# Patient Record
Sex: Female | Born: 1960
Health system: Southern US, Community
[De-identification: ages and names within clinical notes are randomized; demographics above are authoritative.]

## PROBLEM LIST (undated history)

## (undated) DIAGNOSIS — F32A Depression, unspecified: Secondary | ICD-10-CM

## (undated) DIAGNOSIS — N814 Uterovaginal prolapse, unspecified: Secondary | ICD-10-CM

## (undated) DIAGNOSIS — N390 Urinary tract infection, site not specified: Secondary | ICD-10-CM

## (undated) DIAGNOSIS — F329 Major depressive disorder, single episode, unspecified: Secondary | ICD-10-CM

## (undated) DIAGNOSIS — N852 Hypertrophy of uterus: Secondary | ICD-10-CM

## (undated) DIAGNOSIS — M1990 Unspecified osteoarthritis, unspecified site: Secondary | ICD-10-CM

## (undated) DIAGNOSIS — M810 Age-related osteoporosis without current pathological fracture: Secondary | ICD-10-CM

## (undated) DIAGNOSIS — R519 Headache, unspecified: Secondary | ICD-10-CM

## (undated) DIAGNOSIS — N840 Polyp of corpus uteri: Secondary | ICD-10-CM

## (undated) DIAGNOSIS — D72819 Decreased white blood cell count, unspecified: Secondary | ICD-10-CM

## (undated) HISTORY — PX: CYSTOSCOPY: SUR368

## (undated) HISTORY — DX: Urinary tract infection, site not specified: N39.0

## (undated) HISTORY — DX: Polyp of corpus uteri: N84.0

## (undated) HISTORY — DX: Uterovaginal prolapse, unspecified: N81.4

## (undated) HISTORY — DX: Major depressive disorder, single episode, unspecified: F32.9

## (undated) HISTORY — DX: Decreased white blood cell count, unspecified: D72.819

## (undated) HISTORY — DX: Depression, unspecified: F32.A

## (undated) HISTORY — PX: COLONOSCOPY: SHX174

## (undated) HISTORY — DX: Hypertrophy of uterus: N85.2

## (undated) HISTORY — DX: Age-related osteoporosis without current pathological fracture: M81.0

---

## 1992-01-30 HISTORY — PX: TUBAL LIGATION: SHX77

## 1996-01-30 HISTORY — PX: CHOLECYSTECTOMY: SHX55

## 2014-01-13 ENCOUNTER — Encounter: Payer: Self-pay | Admitting: Obstetrics and Gynecology

## 2014-01-13 ENCOUNTER — Ambulatory Visit (INDEPENDENT_AMBULATORY_CARE_PROVIDER_SITE_OTHER): Payer: BC Managed Care – PPO | Admitting: Obstetrics and Gynecology

## 2014-01-13 ENCOUNTER — Other Ambulatory Visit: Payer: Self-pay | Admitting: Obstetrics and Gynecology

## 2014-01-13 VITALS — BP 94/60 | HR 68 | Resp 16 | Ht 67.0 in | Wt 128.0 lb

## 2014-01-13 DIAGNOSIS — Z0000 Encounter for general adult medical examination without abnormal findings: Secondary | ICD-10-CM

## 2014-01-13 DIAGNOSIS — N951 Menopausal and female climacteric states: Secondary | ICD-10-CM

## 2014-01-13 DIAGNOSIS — K219 Gastro-esophageal reflux disease without esophagitis: Secondary | ICD-10-CM

## 2014-01-13 DIAGNOSIS — Z01419 Encounter for gynecological examination (general) (routine) without abnormal findings: Secondary | ICD-10-CM

## 2014-01-13 DIAGNOSIS — Z1211 Encounter for screening for malignant neoplasm of colon: Secondary | ICD-10-CM

## 2014-01-13 DIAGNOSIS — IMO0001 Reserved for inherently not codable concepts without codable children: Secondary | ICD-10-CM

## 2014-01-13 LAB — POCT URINALYSIS DIPSTICK
Bilirubin, UA: NEGATIVE
Blood, UA: NEGATIVE
Glucose, UA: NEGATIVE
Ketones, UA: NEGATIVE
Leukocytes, UA: NEGATIVE
Nitrite, UA: NEGATIVE
Protein, UA: NEGATIVE
Urobilinogen, UA: NEGATIVE
pH, UA: 6

## 2014-01-13 LAB — CBC
HCT: 37.7 % (ref 36.0–46.0)
Hemoglobin: 12.8 g/dL (ref 12.0–15.0)
MCH: 30.5 pg (ref 26.0–34.0)
MCHC: 34 g/dL (ref 30.0–36.0)
MCV: 89.8 fL (ref 78.0–100.0)
MPV: 10.8 fL (ref 9.4–12.4)
Platelets: 197 10*3/uL (ref 150–400)
RBC: 4.2 MIL/uL (ref 3.87–5.11)
RDW: 13.5 % (ref 11.5–15.5)
WBC: 5.5 10*3/uL (ref 4.0–10.5)

## 2014-01-13 LAB — HEMOGLOBIN, FINGERSTICK: Hemoglobin, fingerstick: 12.9 g/dL (ref 12.0–16.0)

## 2014-01-13 MED ORDER — ESTRADIOL 0.1 MG/GM VA CREA: TOPICAL_CREAM | VAGINAL | Status: DC

## 2014-01-13 MED ORDER — NITROFURANTOIN MONOHYD MACRO 100 MG PO CAPS: ORAL_CAPSULE | ORAL | Status: DC

## 2014-01-13 NOTE — Progress Notes (Signed)
53 y.o. [id number] MarriedCaucasianF here for annual exam.    Developed heavy menses.  Diagnosed with hysteroscopy with polypectomy.  Skipped cycles from March 2014 until September 2014, when heavy cycle resumed.  Now no menses since then.   Only a few hot flashes.  Vaginal dryness. Declines HRT.  Some breast tenderness this week.  Hx of left breast cysts.   History of recurrent UTIs more recently since menopause. 3 UTIs this year. Usually takes Macrobid for prophylaxis with intercourse and would like a refill.  [place]   Patient's last menstrual period was 09/29/2012.          Sexually active: Yes.    The current method of family planning is tubal ligation and post menopausal status.    Exercising: No.  The patient does not participate in regular exercise at present. Smoker:  no  Health Maintenance: Pap: 2014 Normal  History of abnormal Pap:  Yes, after the birth of her third child.  History of cryotherapy in 1994.  MMG:  03/2013 Normal - Dense Breast tissue Colonoscopy:  never BMD:   never TDaP: <10 years. Screening Labs: today, Hb today: 12.9, Urine today: Negative.   reports that she has never smoked. She has never used smokeless tobacco. She reports that she drinks about 0.6 - 1.2 oz of alcohol per week. She reports that she does not use illicit drugs.  Past Medical History  Diagnosis Date  . Hypertrophy of uterus   . Uterine prolapse   . Polyp of corpus uteri   . Depression     Past Surgical History  Procedure Laterality Date  . Cholecystectomy  1998  . Tubal ligation  1994    Current Outpatient Prescriptions  Medication Sig Dispense Refill  . citalopram (CELEXA) 20 MG tablet Take 20 mg by mouth at bedtime.  0  . FROVA 2.5 MG tablet Take 2.5 mg by mouth as needed.   3   No current facility-administered medications for this visit.    Family History  Problem Relation Age of Onset  . Hypertension Mother   . Neuropathy Mother      ROS:  Pertinent items are noted in HPI.  Otherwise, a comprehensive ROS was negative.  Exam:   BP 94/60 mmHg  Pulse 68  Resp 16  Ht 5\' 7"  (1.702 m)  Wt 128 lb (58.06 kg)  BMI 20.04 kg/m2  LMP 09/29/2012     Height: 5\' 7"  (170.2 cm)  Ht Readings from Last 3 Encounters:  01/13/14 5\' 7"  (1.702 m)    General appearance: alert, cooperative and appears stated age Head: Normocephalic, without obvious abnormality, atraumatic Neck: no adenopathy, supple, symmetrical, trachea midline and thyroid normal to inspection and palpation Lungs: clear to auscultation bilaterally Breasts: normal appearance, no masses or tenderness, No nipple retraction or dimpling, No nipple discharge or bleeding, No axillary or supraclavicular adenopathy, Left lateral breast skin with 3 mm dark brown/black flat nevus. Heart: regular rate and rhythm Abdomen: soft, non-tender; bowel sounds normal; no masses,  no organomegaly Extremities: extremities normal, atraumatic, no cyanosis or edema Skin: Skin color, texture, turgor normal. No rashes or lesions Lymph nodes: Cervical, supraclavicular, and axillary nodes normal. No abnormal inguinal nodes palpated Neurologic: Grossly normal   Pelvic: External genitalia:  no lesions              Urethra:  normal appearing urethra with no masses, tenderness or lesions  Bartholins and Skenes: normal                 Vagina: normal appearing vagina with normal color and discharge, no lesions              Cervix: no lesions              Pap taken: No. Bimanual Exam:  Uterus:  normal size, contour, position, consistency, mobility, non-tender              Adnexa: normal adnexa and no mass, fullness, tenderness               Rectovaginal: Confirms               Anus:  normal sphincter tone, no lesions  Chaperone was present for exam.  A:  Well Woman with normal exam Menopausal female. Vaginal atrophic changes.  Skin lesion of left breast skin.  History of left  breast cysts. Some recent breast sensitivity.  I do not feel a discreet lump. Recurrent UTIs. Need for colon cancer screening and evaluation for reflux.   P:   Mammogram yearly.  Given information for [facility].  pap smear not indicated.  FSH and estradiol.  Estradiol cream.  Instructed in used.  See orders.  Discussed risks of breast cancer, DVT, PE, MI, stroke. Routine labs.  Brochure on menopause.  Will see dermatology.  Gave names to patient.  Referral to Dr. Loreta AveMann, GI. return annually or prn

## 2014-01-13 NOTE — Progress Notes (Deleted)
53 y.o. No obstetric history on file. Married{Race/ethnicity:17218}F here for annual exam.    No LMP recorded.          Sexually active: {yes no:314532}  The current method of family planning is {contraception:315051}.    Exercising: {yes no:314532}  {types:19826} Smoker:  {YES NO:22349}  Health Maintenance: Pap:  *** History of abnormal Pap:  {YES NO:22349} MMG:  *** Colonoscopy:  *** BMD:   *** TDaP:  *** Screening Labs: ***, Hb today: ***, Urine today: ***     No past medical history on file.  No past surgical history on file.  Current Outpatient Prescriptions  Medication Sig Dispense Refill  . citalopram (CELEXA) 20 MG tablet Take 20 mg by mouth at bedtime.  0  . FROVA 2.5 MG tablet   3  . nitrofurantoin, macrocrystal-monohydrate, (MACROBID) 100 MG capsule   0   No current facility-administered medications for this visit.    No family history on file.  ROS:  Pertinent items are noted in HPI.  Otherwise, a comprehensive ROS was negative.  Exam:   There were no vitals taken for this visit.  Weight change: @WEIGHTCHANGE @ Height:      Ht Readings from Last 3 Encounters:  No data found for Ht    General appearance: alert, cooperative and appears stated age Head: Normocephalic, without obvious abnormality, atraumatic Neck: no adenopathy, supple, symmetrical, trachea midline and thyroid {EXAM; THYROID:18604} Lungs: clear to auscultation bilaterally Breasts: {Exam; breast:13139::"normal appearance, no masses or tenderness"} Heart: regular rate and rhythm Abdomen: soft, non-tender; bowel sounds normal; no masses,  no organomegaly Extremities: extremities normal, atraumatic, no cyanosis or edema Skin: Skin color, texture, turgor normal. No rashes or lesions Lymph nodes: Cervical, supraclavicular, and axillary nodes normal. No abnormal inguinal nodes palpated Neurologic: Grossly normal   Pelvic: External genitalia:  no lesions              Urethra:  normal appearing  urethra with no masses, tenderness or lesions              Bartholins and Skenes: normal                 Vagina: normal appearing vagina with normal color and discharge, no lesions              Cervix: {exam; cervix:14595}              Pap taken: {yes no:314532} Bimanual Exam:  Uterus:  {exam; uterus:12215}              Adnexa: {exam; adnexa:12223}               Rectovaginal: Confirms               Anus:  normal sphincter tone, no lesions  Chaperone was present for exam.  A:  Well Woman with normal exam  P:   {plan; gyn:5269::"mammogram","pap smear","return annually or prn"}

## 2014-01-13 NOTE — Patient Instructions (Signed)

## 2014-01-14 LAB — COMPREHENSIVE METABOLIC PANEL
ALT: 16 U/L (ref 0–35)
AST: 25 U/L (ref 0–37)
Albumin: 4.6 g/dL (ref 3.5–5.2)
Alkaline Phosphatase: 57 U/L (ref 39–117)
BUN: 10 mg/dL (ref 6–23)
CO2: 30 mEq/L (ref 19–32)
Calcium: 9.5 mg/dL (ref 8.4–10.5)
Chloride: 99 mEq/L (ref 96–112)
Creat: 0.58 mg/dL (ref 0.50–1.10)
Glucose, Bld: 105 mg/dL — ABNORMAL HIGH (ref 70–99)
Potassium: 3.7 mEq/L (ref 3.5–5.3)
Sodium: 138 mEq/L (ref 135–145)
Total Bilirubin: 0.8 mg/dL (ref 0.2–1.2)
Total Protein: 6.9 g/dL (ref 6.0–8.3)

## 2014-01-14 LAB — LIPID PANEL
Cholesterol: 165 mg/dL (ref 0–200)
HDL: 94 mg/dL (ref >39)
LDL Cholesterol: 59 mg/dL (ref 0–99)
Total CHOL/HDL Ratio: 1.8 Ratio
Triglycerides: 58 mg/dL (ref <150)
VLDL: 12 mg/dL (ref 0–40)

## 2014-01-14 LAB — FOLLICLE STIMULATING HORMONE: FSH: 36.9 m[IU]/mL

## 2014-01-14 LAB — ESTRADIOL: Estradiol: 213.8 pg/mL

## 2014-01-15 LAB — TSH: TSH: 0.945 u[IU]/mL (ref 0.350–4.500)

## 2014-01-15 NOTE — Addendum Note (Signed)
Addended by: Annamaria HellingAMUNDSON DE CARVALHO E SILVA, BROOK E on: 01/15/2014 07:07 AM   Modules accepted: Orders

## 2014-01-16 LAB — PROLACTIN: Prolactin: 8.6 ng/mL

## 2014-01-18 ENCOUNTER — Telehealth: Payer: Self-pay

## 2014-01-18 DIAGNOSIS — N912 Amenorrhea, unspecified: Secondary | ICD-10-CM

## 2014-01-18 NOTE — Telephone Encounter (Signed)
Please inform patient that her TSH and Prolactin are in a normal range.          We are in the process of evaluating amenorrhea for her.         I would like for the patient to see me in January for a pelvic ultrasound and possible endometrial biopsy to evaluate the lining of her uterus.    Her hormone testing indicates she is premenopausal, but she has not had a cycle for over a year.         Cc - Maureen LawsAmanda Dixon

## 2014-01-18 NOTE — Telephone Encounter (Signed)
Spoke with patient. Advised patient of message as seen below from Dr.Silva. Patient is agreeable and verbalizes understanding. Appointment scheduled for PUS and possible EMB on 02/04/14 at 9:30am with 10am consult with Dr.Silva. Patient is agreeable to date and time. Advised patient to take 800 mg of ibuprofen/motrin 1 hours before appointment in case EMB is needed. Patient is agreeable. Order placed for PUS and EMB. Will need precert.  Cc: Maureen Davis  Routing to provider for final review. Patient agreeable to disposition. Will close encounter

## 2014-01-18 NOTE — Telephone Encounter (Signed)
2016 benefits are not available at this time.

## 2014-01-18 NOTE — Telephone Encounter (Signed)
-----   Message from [place] E Amundson de G[place]enevere Ghaziarvalho E Silva, MD sent at 01/15/2014  7:11 AM EST ----- Please let patient know that she is not menopausal by her lab work.  FSH is not really elevated and estrogen levels are still high.  I have added a prolactin and TSH to her blood work to look for other reasons for her not cycling.  (It is possible that another menstruation is about to happen.)  I know that she has had a tubal ligation, but even a urine pregnancy test is part of the protocol to understand this further.  We did not do one in the office the other day as we thought she was just in menopause before we did the blood work.

## 2014-01-29 DIAGNOSIS — D72819 Decreased white blood cell count, unspecified: Secondary | ICD-10-CM

## 2014-01-29 HISTORY — DX: Decreased white blood cell count, unspecified: D72.819

## 2014-02-02 ENCOUNTER — Telehealth: Payer: Self-pay | Admitting: Obstetrics and Gynecology

## 2014-02-02 NOTE — Telephone Encounter (Signed)
Pt has new insurance. Please call to precert for ultrasound scheduled on Thursday. [employer]/ ID# [id number] GROUP# [id number]. Told patient I will give information to [place] who will call her once it has been precerted.

## 2014-02-02 NOTE — Telephone Encounter (Signed)
Left message for patient to call back  

## 2014-02-02 NOTE — Telephone Encounter (Signed)
Left message for patient to call back. Need to go over benefits and out of pocket expectation for appt scheduled 01.07.2016.

## 2014-02-02 NOTE — Telephone Encounter (Signed)
Returned call

## 2014-02-02 NOTE — Telephone Encounter (Signed)
Pr [phone]

## 2014-02-03 NOTE — Telephone Encounter (Signed)
Dr.Silva, patient is scheduled for PUS tomorrow for evaluation of amenorrhea. Patients OOP cost is high and she would like to know if this is needed at this time. Patient has also sent in old records for your review which I have placed on your desk. Please advise.

## 2014-02-03 NOTE — Telephone Encounter (Signed)
Spoke with patient. Advised patient of message as seen below from Dr.Silva. Patient is agreeable and verbalizes understanding. Will keep appointment for 02/04/14 for PUS as scheduled with Dr.Silva.  Routing to provider for final review. Patient agreeable to disposition. Will close encounter

## 2014-02-03 NOTE — Telephone Encounter (Signed)
I recommend proceeding with pelvic ultrasound. No menses in over a year and her lab testing looks like premenopause.  We need to evaluate for endometrial hyperplasia. May also need endometrial biopsy. Would be determined after seeing ultrasound report and images.  She may choose to have [facility] - if they accept payment plans.  The overall cost there would be higher.

## 2014-02-03 NOTE — Telephone Encounter (Signed)
Per Martie LeeSabrina, gave patient the information on what she is responsible for out of pocket when she come in for ultrasound tomorrow. She was agreeable and said ok.

## 2014-02-03 NOTE — Telephone Encounter (Signed)
Jannet AskewKaitlyn E Hines, RN at 01/18/2014 12:09 PM     Status: Signed       Expand All Collapse All   Please inform patient that her TSH and Prolactin are in a normal range.          We are in the process of evaluating amenorrhea for her.         I would like for the patient to see me in January for a pelvic ultrasound and possible endometrial biopsy to evaluate the lining of her uterus.    Her hormone testing indicates she is premenopausal, but she has not had a cycle for over a year.         Cc - Claudette LawsAmanda Dixon                Jannet AskewKaitlyn E Hines, RN at 01/18/2014 12:09 PM     Status: Signed       Expand All Collapse All   ----- Message from [place] E Amundson de Gwenevere Ghaziarvalho E Silva, MD sent at 01/15/2014 7:11 AM EST ----- Please let patient know that she is not menopausal by her lab work.  FSH is not really elevated and estrogen levels are still high.  I have added a prolactin and TSH to her blood work to look for other reasons for her not cycling.  (It is possible that another menstruation is about to happen.)  I know that she has had a tubal ligation, but even a urine pregnancy test is part of the protocol to understand this further.  We did not do one in the office the other day as we thought she was just in menopause before we did the blood work.

## 2014-02-03 NOTE — Telephone Encounter (Signed)
Pt would like to possibly speak with Dr Edward JollySilva to see if she has reviewed her old records prior to appointment for ultrasound to see if she really need to have one done.

## 2014-02-04 ENCOUNTER — Encounter: Payer: Self-pay | Admitting: Obstetrics and Gynecology

## 2014-02-04 ENCOUNTER — Ambulatory Visit (INDEPENDENT_AMBULATORY_CARE_PROVIDER_SITE_OTHER): Payer: 59

## 2014-02-04 ENCOUNTER — Ambulatory Visit (INDEPENDENT_AMBULATORY_CARE_PROVIDER_SITE_OTHER): Payer: 59 | Admitting: Obstetrics and Gynecology

## 2014-02-04 VITALS — BP 100/58 | HR 68 | Ht 67.0 in | Wt 130.0 lb

## 2014-02-04 DIAGNOSIS — R938 Abnormal findings on diagnostic imaging of other specified body structures: Secondary | ICD-10-CM

## 2014-02-04 DIAGNOSIS — N912 Amenorrhea, unspecified: Secondary | ICD-10-CM

## 2014-02-04 DIAGNOSIS — R9389 Abnormal findings on diagnostic imaging of other specified body structures: Secondary | ICD-10-CM

## 2014-02-04 NOTE — Patient Instructions (Signed)
Endometrial Biopsy, Care After Refer to this sheet in the next few weeks. These instructions provide you with information on caring for yourself after your procedure. Your health care provider may also give you more specific instructions. Your treatment has been planned according to current medical practices, but problems sometimes occur. Call your health care provider if you have any problems or questions after your procedure. WHAT TO EXPECT AFTER THE PROCEDURE After your procedure, it is typical to have the following:  You may have mild cramping and a small amount of vaginal bleeding for a few days after the procedure. This is normal. HOME CARE INSTRUCTIONS  Only take over-the-counter or prescription medicine as directed by your health care provider.  Do not douche, use tampons, or have sexual intercourse until your health care provider approves.  Follow your health care provider's instructions regarding any activity restrictions, such as strenuous exercise or heavy lifting. SEEK MEDICAL CARE IF:  You have heavy bleeding or bleeding longer than 2 days after the procedure.  You have bad smelling drainage from your vagina.  You have a fever and chills.  Youhave severe lower stomach (abdominal) pain. SEEK IMMEDIATE MEDICAL CARE IF:  You have severe cramps in your stomach or back.  You pass large blood clots.  Your bleeding increases.  You become weak or lightheaded, or you pass out. Document Released: 11/05/2012 Document Reviewed: 11/05/2012 ExitCare Patient Information 2015 [employer]. This information is not intended to replace advice given to you by your health care provider. Make sure you discuss any questions you have with your health care provider.  

## 2014-02-04 NOTE — Progress Notes (Signed)
Subjective  Patient is here today for a pelvic ultrasound for amenorrhea and premenopausal FSH.  FSH 36.9, E2 213.8. Not taking any HRT.   LMP Sept. 2014.  Status post BTL.  History of [place] hyster[place].   Objective  See ultrasound - images and report reviewed with patient.   Uterus with no masses.  Endometrial focus 10 mm, no blood supply.  Left ovary with 14 mm follicle.  Right ovary unremarkable.  No free fluid.     Procedure - endometrial biopsy Consent performed. Speculum place in vagina.  Sterile prep of cervix with Hibiclens. Pipelle placed to   8       cm without difficulty twice. Tissue obtained and sent to pathology. Speculum removed.  No complications.  Assessment   Thickened endometrium.  Amenorrhea. Elevated estradiol level and lowish FSH.  History of prior hysteroscopic polypectomy.  Atrophic vagina.   Plan   Discussed ultrasound findings.  Follow up EMB. Anticipated Provera 10 mg po q day for 10 days minimum.  ? Repeat hysteroscopy.   25 minutes face to face time of which ober 50% was spent in counseling.   After visit summary to patient.

## 2014-02-08 LAB — IPS OTHER TISSUE BIOPSY

## 2014-02-10 ENCOUNTER — Telehealth: Payer: Self-pay

## 2014-02-10 MED ORDER — MEDROXYPROGESTERONE ACETATE 10 MG PO TABS: 10.0000 mg | ORAL_TABLET | Freq: Every day | ORAL | Status: DC

## 2014-02-10 NOTE — Telephone Encounter (Signed)
-----   Message from [place] E Amundson de G[place]enevere Ghaziarvalho E Silva, MD sent at 02/09/2014  5:44 PM EST ----- Please contact patient with the good news about her negative endometrial biopsy.  No polyp, cancer, or hyperplasia was seen.   I am recommending Provera 10 mg po q day for 14 days.  Please send to her pharmacy of choice.  She and I discussed this at her ultrasound visit as the likely plan.   Have her keep a bleeding calendar and call to report if she had a cycle with the Provera.   Cc- Claudette LawsAmanda Dixon

## 2014-02-10 NOTE — Telephone Encounter (Signed)
Spoke with patient. Results given as seen below. Patient is agreeable and verbalizes understanding. Rx for Provera 10mg  #14 0RF sent to [facility] on file. Patient is agreeable. Will monitor bleeding and return call with positive or negative bleed.  Routing to provider for final review. Patient agreeable to disposition. Will close encounter

## 2014-02-10 NOTE — Telephone Encounter (Signed)
Left message to call Shamarra Warda at [phone]. 

## 2014-02-10 NOTE — Telephone Encounter (Signed)
Returning call.

## 2014-12-17 ENCOUNTER — Encounter: Payer: Self-pay | Admitting: Obstetrics and Gynecology

## 2014-12-17 ENCOUNTER — Ambulatory Visit (INDEPENDENT_AMBULATORY_CARE_PROVIDER_SITE_OTHER): Payer: 59 | Admitting: Obstetrics and Gynecology

## 2014-12-17 VITALS — BP 110/60 | HR 68 | Resp 14 | Ht 67.0 in | Wt 131.4 lb

## 2014-12-17 DIAGNOSIS — Z01419 Encounter for gynecological examination (general) (routine) without abnormal findings: Secondary | ICD-10-CM | POA: Diagnosis not present

## 2014-12-17 DIAGNOSIS — Z0000 Encounter for general adult medical examination without abnormal findings: Secondary | ICD-10-CM

## 2014-12-17 DIAGNOSIS — N951 Menopausal and female climacteric states: Secondary | ICD-10-CM | POA: Diagnosis not present

## 2014-12-17 LAB — POCT URINALYSIS DIPSTICK
Bilirubin, UA: NEGATIVE
Blood, UA: NEGATIVE
Glucose, UA: NEGATIVE
Ketones, UA: NEGATIVE
Leukocytes, UA: NEGATIVE
Nitrite, UA: NEGATIVE
Protein, UA: NEGATIVE
Urobilinogen, UA: NEGATIVE
pH, UA: 5

## 2014-12-17 NOTE — Progress Notes (Signed)
Patient ID: Maureen Davis, female   DOB: 25-Sep-1960, 54 y.o.   MRN: [id number] 54 y.o. [id number] Married Caucasian female here for annual exam.    Had EMB in January for amenorrhea and thickened endometrium.  Labs consistent with perimenopause.  Took a course of Provera.  She did have a cycle.  No menses since January 2016.  Normal TSH and prolactin.   Some hot flashes.  Vaginal dryness treated with coconut oil. Declines hormonal therapy.   Seeing Dr. Sherron MondayMacDiarmid for UTIs.  Taking Macrobid daily.  Had a renal ultrasound and cystoscopy which were normal.   Has an appointment for a derm check in January.  Oldest son just married in [place].  PCP: None    LMP was 02/18/14.       Sexually active: Yes.   female The current method of family planning is tubal ligation.    Exercising: No.   Smoker:  no  Health Maintenance: Pap:  2014 normal History of abnormal Pap:  Yes, 1994 hx of cryotherapy to cervix. MMG:  10-07-12 dense/Neg/BiRads1:[facility] in [place].  Patient did have a Diag.and U/S of left breast 03/30/13 - benign cysts noted.  Colonoscopy: 2016 normal with Dr. Loreta AveMann.  Next 2026. BMD:   n/a  Result  n/a TDaP:  8-9 years ago Screening Labs:  Hb today: 12.4, Urine today: Neg   reports that she has never smoked. She has never used smokeless tobacco. She reports that she drinks about 1.8 oz of alcohol per week. She reports that she does not use illicit drugs.  Past Medical History  Diagnosis Date  . Hypertrophy of uterus   . Uterine prolapse   . Polyp of corpus uteri   . Depression   . Chronic UTI   . Frequent UTI     --sees Dr.McDiarmid    Past Surgical History  Procedure Laterality Date  . Cholecystectomy  1998  . Tubal ligation  1994    Current Outpatient Prescriptions  Medication Sig Dispense Refill  . citalopram (CELEXA) 20 MG tablet Take 20 mg by mouth at bedtime.  0  . nitrofurantoin, macrocrystal-monohydrate, (MACROBID) 100 MG capsule Take one capsule by mouth with  intercourse. 30 capsule 2   No current facility-administered medications for this visit.    Family History  Problem Relation Age of Onset  . Hypertension Mother   . Neuropathy Mother   . Osteoarthritis Mother   . Hypertension Father   . Osteoarthritis Father     ROS:  Pertinent items are noted in HPI.  Otherwise, a comprehensive ROS was negative.  Exam:   BP 110/60 mmHg  Pulse 68  Resp 14  Ht 5\' 7"  (1.702 m)  Wt 131 lb 6.4 oz (59.603 kg)  BMI 20.58 kg/m2  LMP 10/20/2014 (Exact Date)    General appearance: alert, cooperative and appears stated age Head: Normocephalic, without obvious abnormality, atraumatic Neck: no adenopathy, supple, symmetrical, trachea midline and thyroid normal to inspection and palpation Lungs: clear to auscultation bilaterally Breasts: normal appearance, no masses or tenderness, Inspection negative, No nipple retraction or dimpling, No nipple discharge or bleeding, No axillary or supraclavicular adenopathy, flat dark brown/black pigmented nevus of left breast - 3  - 4 mm. Heart: regular rate and rhythm Abdomen: soft, non-tender; bowel sounds normal; no masses,  no organomegaly Extremities: extremities normal, atraumatic, no cyanosis or edema Skin: Skin color, texture, turgor normal. No rashes or lesions Lymph nodes: Cervical, supraclavicular, and axillary nodes normal. No abnormal inguinal nodes palpated Neurologic: Grossly normal  Pelvic: External genitalia:  no lesions              Urethra:  normal appearing urethra with no masses, tenderness or lesions              Bartholins and Skenes: normal                 Vagina: normal appearing vagina with normal color and discharge, no lesions              Cervix: no lesions              Pap taken: Yes.   Bimanual Exam:  Uterus:  normal size, contour, position, consistency, mobility, non-tender              Adnexa: normal adnexa and no mass, fullness, tenderness              Rectovaginal: Yes.  .   Confirms.              Anus:  normal sphincter tone, no lesions  Chaperone was present for exam.  Assessment:   Well woman visit with normal exam. Menopausal symptoms.  Vaginal atrophy.  Frequent UTIs.   Plan: Yearly mammogram recommended after age 25.   Patient will call [facility] to schedule.  Recommended self breast exam.  Pap and HR HPV as above. Discussed Calcium, Vitamin D, regular exercise program including cardiovascular and weight bearing exercise. Labs performed.  Yes.  .   See orders.  Routine labs and FSH/estradiol. Refills given on medications.  No..  See orders. I recommend getting mammogram done before starting vaginal estrogen cream.  Discussed risks of breast cancer, DVT, PE, MI, and stroke  Declines HRT. Follow up annually and prn.    After visit summary provided.

## 2014-12-17 NOTE — Patient Instructions (Signed)

## 2014-12-18 LAB — LIPID PANEL
Cholesterol: 169 mg/dL (ref 125–200)
HDL: 92 mg/dL (ref >=46)
LDL Cholesterol: 67 mg/dL (ref <130)
Total CHOL/HDL Ratio: 1.8 Ratio (ref <=5.0)
Triglycerides: 49 mg/dL (ref <150)
VLDL: 10 mg/dL (ref <30)

## 2014-12-18 LAB — CBC
HCT: 40 % (ref 36.0–46.0)
Hemoglobin: 13 g/dL (ref 12.0–15.0)
MCH: 30.4 pg (ref 26.0–34.0)
MCHC: 32.5 g/dL (ref 30.0–36.0)
MCV: 93.7 fL (ref 78.0–100.0)
MPV: 10.7 fL (ref 8.6–12.4)
Platelets: 185 10*3/uL (ref 150–400)
RBC: 4.27 MIL/uL (ref 3.87–5.11)
RDW: 13.2 % (ref 11.5–15.5)
WBC: 3.8 10*3/uL — ABNORMAL LOW (ref 4.0–10.5)

## 2014-12-18 LAB — COMPREHENSIVE METABOLIC PANEL
ALT: 15 U/L (ref 6–29)
AST: 24 U/L (ref 10–35)
Albumin: 4.3 g/dL (ref 3.6–5.1)
Alkaline Phosphatase: 57 U/L (ref 33–130)
BUN: 11 mg/dL (ref 7–25)
CO2: 30 mmol/L (ref 20–31)
Calcium: 9.4 mg/dL (ref 8.6–10.4)
Chloride: 102 mmol/L (ref 98–110)
Creat: 0.65 mg/dL (ref 0.50–1.05)
Glucose, Bld: 68 mg/dL (ref 65–99)
Potassium: 4.1 mmol/L (ref 3.5–5.3)
Sodium: 140 mmol/L (ref 135–146)
Total Bilirubin: 0.6 mg/dL (ref 0.2–1.2)
Total Protein: 6.7 g/dL (ref 6.1–8.1)

## 2014-12-18 LAB — ESTRADIOL: Estradiol: 18.1 pg/mL

## 2014-12-18 LAB — FOLLICLE STIMULATING HORMONE: FSH: 124.1 m[IU]/mL — ABNORMAL HIGH

## 2014-12-19 ENCOUNTER — Other Ambulatory Visit: Payer: Self-pay | Admitting: Obstetrics and Gynecology

## 2014-12-19 ENCOUNTER — Encounter: Payer: Self-pay | Admitting: Obstetrics and Gynecology

## 2014-12-19 DIAGNOSIS — D72819 Decreased white blood cell count, unspecified: Secondary | ICD-10-CM

## 2014-12-20 LAB — HEMOGLOBIN, FINGERSTICK: Hemoglobin, fingerstick: 12.4 g/dL (ref 12.0–16.0)

## 2014-12-20 NOTE — Addendum Note (Signed)
Addended by: Ardell IsaacsAMUNDSON C SILVA, Debbe BalesBROOK E on: 12/20/2014 06:14 PM   Modules accepted: Orders

## 2014-12-27 LAB — IPS PAP TEST WITH HPV

## 2015-02-24 ENCOUNTER — Telehealth: Payer: Self-pay

## 2015-02-24 NOTE — Telephone Encounter (Signed)
Spoke with patient. Recheck lab appointment scheduled for 02/25/2015 at 9 am. She is agreeable to date and time.  Routing to provider for final review. Patient agreeable to disposition. Will close encounter.

## 2015-02-24 NOTE — Telephone Encounter (Signed)
-----   Message from Patton Salles, MD sent at 02/23/2015  9:56 PM EST ----- Regarding: please inform patinet she is due for her CBC with diff recheck Please contact patient and let her know that she is due to have her CBC with diff rechecked.  She had a low WBC.  She popped up in my EPIC reminder box.   Thanks,   Brook ----- Message -----    From: SYSTEM    Sent: 02/23/2015  12:05 AM      To: Patton Salles, MD

## 2015-02-25 ENCOUNTER — Other Ambulatory Visit (INDEPENDENT_AMBULATORY_CARE_PROVIDER_SITE_OTHER): Payer: 59

## 2015-02-25 DIAGNOSIS — D72819 Decreased white blood cell count, unspecified: Secondary | ICD-10-CM

## 2015-02-25 LAB — CBC WITH DIFFERENTIAL/PLATELET
Basophils Absolute: 0 10*3/uL (ref 0.0–0.1)
Basophils Relative: 1 % (ref 0–1)
Eosinophils Absolute: 0.2 10*3/uL (ref 0.0–0.7)
Eosinophils Relative: 5 % (ref 0–5)
HCT: 40.9 % (ref 36.0–46.0)
Hemoglobin: 13.4 g/dL (ref 12.0–15.0)
Lymphocytes Relative: 26 % (ref 12–46)
Lymphs Abs: 0.9 10*3/uL (ref 0.7–4.0)
MCH: 29.8 pg (ref 26.0–34.0)
MCHC: 32.8 g/dL (ref 30.0–36.0)
MCV: 91.1 fL (ref 78.0–100.0)
MPV: 10.5 fL (ref 8.6–12.4)
Monocytes Absolute: 0.3 10*3/uL (ref 0.1–1.0)
Monocytes Relative: 10 % (ref 3–12)
Neutro Abs: 1.9 10*3/uL (ref 1.7–7.7)
Neutrophils Relative %: 58 % (ref 43–77)
Platelets: 174 10*3/uL (ref 150–400)
RBC: 4.49 MIL/uL (ref 3.87–5.11)
RDW: 13.4 % (ref 11.5–15.5)
WBC: 3.3 10*3/uL — ABNORMAL LOW (ref 4.0–10.5)

## 2015-03-02 ENCOUNTER — Telehealth: Payer: Self-pay | Admitting: Emergency Medicine

## 2015-03-02 DIAGNOSIS — D72819 Decreased white blood cell count, unspecified: Secondary | ICD-10-CM

## 2015-03-02 NOTE — Telephone Encounter (Signed)
-----   Message from Patton Salles, MD sent at 02/27/2015  9:12 AM EST ----- Please contact patient with her CBC with differential results. We have been monitoring this for the patient, and her CBC continues to drift downward.  The ratios are ok, but the overall number is low.  I would like to have the patient seen Hematology Oncology to have an evaluation.  Please make this referral.   Cc- Claudette Laws

## 2015-03-10 ENCOUNTER — Telehealth: Payer: Self-pay | Admitting: Hematology & Oncology

## 2015-03-10 NOTE — Telephone Encounter (Signed)
Called patient and left a message confirming upcoming appt(s).       [facility]. °

## 2015-03-24 ENCOUNTER — Other Ambulatory Visit (HOSPITAL_BASED_OUTPATIENT_CLINIC_OR_DEPARTMENT_OTHER): Payer: 59

## 2015-03-24 ENCOUNTER — Encounter: Payer: Self-pay | Admitting: Hematology & Oncology

## 2015-03-24 ENCOUNTER — Ambulatory Visit: Payer: 59

## 2015-03-24 ENCOUNTER — Ambulatory Visit (HOSPITAL_BASED_OUTPATIENT_CLINIC_OR_DEPARTMENT_OTHER): Payer: 59 | Admitting: Hematology & Oncology

## 2015-03-24 VITALS — BP 97/62 | HR 69 | Temp 98.2°F | Resp 16 | Ht 67.0 in | Wt 133.0 lb

## 2015-03-24 DIAGNOSIS — D72819 Decreased white blood cell count, unspecified: Secondary | ICD-10-CM | POA: Diagnosis not present

## 2015-03-24 DIAGNOSIS — N390 Urinary tract infection, site not specified: Secondary | ICD-10-CM

## 2015-03-24 LAB — CHCC SATELLITE - SMEAR

## 2015-03-24 LAB — CBC WITH DIFFERENTIAL (CANCER CENTER ONLY)
BASO#: 0 10*3/uL (ref 0.0–0.2)
BASO%: 0.5 % (ref 0.0–2.0)
EOS%: 2.3 % (ref 0.0–7.0)
Eosinophils Absolute: 0.1 10*3/uL (ref 0.0–0.5)
HCT: 38.7 % (ref 34.8–46.6)
HGB: 12.7 g/dL (ref 11.6–15.9)
LYMPH#: 1.3 10*3/uL (ref 0.9–3.3)
LYMPH%: 29.1 % (ref 14.0–48.0)
MCH: 30.5 pg (ref 26.0–34.0)
MCHC: 32.8 g/dL (ref 32.0–36.0)
MCV: 93 fL (ref 81–101)
MONO#: 0.4 10*3/uL (ref 0.1–0.9)
MONO%: 9.2 % (ref 0.0–13.0)
NEUT#: 2.6 10*3/uL (ref 1.5–6.5)
NEUT%: 58.9 % (ref 39.6–80.0)
Platelets: 177 10*3/uL (ref 145–400)
RBC: 4.17 10*6/uL (ref 3.70–5.32)
RDW: 13.2 % (ref 11.1–15.7)
WBC: 4.4 10*3/uL (ref 3.9–10.0)

## 2015-03-24 NOTE — Progress Notes (Signed)
Referral MD  Reason for Referral: transient leukopenia  Chief Complaint  Patient presents with  . Follow-up  : my white blood cell count is low.  HPI: Maureen Davis is a very charming 55 year old white female. She is originallyy from [place]. She and herhusband have been up in the [place] area now for 2 years. She works for a Insurance claims handler.  She sees her gynecologist, Dr. Quincy Simmonds for general medical care. She's been having issues with recurrent urinary tract infections. She has seen urology. There is some thought about putting her on long-term low-dose antibiotics. She was not to keen on that.  Dr. Quincy Simmonds has done lab work on her. About a year ago, her white cell count was 5.5. Hemoglobin 12.8. Platelet count 197,000. In December 2016, her white cell count was 3.8. Hemoglobin 13 and platelet count 185,000. MCV was 94. Most recently, her CBC showed a white cell count of 3.3. Hemoglobin 13.4. Hematocrit 40.9. Platelet count 174,000. She had a normal white cell differential.  Ms. Spike has had no change in medications. She had been taking some Macrobid. I think she is off this now.  She's had no infections as how the urinary tract infections. She's had no rashes. She's had no lymphadenopathy. She's had no weight loss or weight gain. She is up-to-date with her mammograms. She is up-to-date with her colonoscopy.  She does not smoke. She has occasional glass of wine.  She has her gallbladder taken out. This was about 20 years ago.  She's had no foreign travel.  Is no family history of any count of blood problem.  She is kindly refer to the [place] for an evaluation.     Past Medical History  Diagnosis Date  . Hypertrophy of uterus   . Uterine prolapse   . Polyp of corpus uteri   . Depression   . Chronic UTI   . Frequent UTI     --sees Dr.McDiarmid  . Leukopenia 2016    WBC 3.8  :  Past Surgical History  Procedure Laterality Date  .  Cholecystectomy  1998  . Tubal ligation  1994  :   Current outpatient prescriptions:  .  citalopram (CELEXA) 20 MG tablet, Take 20 mg by mouth at bedtime., Disp: , Rfl: 0 .  nitrofurantoin, macrocrystal-monohydrate, (MACROBID) 100 MG capsule, Take one capsule by mouth with intercourse., Disp: 30 capsule, Rfl: 2:  :  No Known Allergies:  Family History  Problem Relation Age of Onset  . Hypertension Mother   . Neuropathy Mother   . Osteoarthritis Mother   . Hypertension Father   . Osteoarthritis Father   :  Social History   Social History  . Marital Status: Married    Spouse Name: N/A  . Number of Children: N/A  . Years of Education: N/A   Occupational History  . Not on file.   Social History Main Topics  . Smoking status: Never Smoker   . Smokeless tobacco: Never Used  . Alcohol Use: 1.8 oz/week    3 Glasses of wine per week  . Drug Use: No  . Sexual Activity:    Partners: Male    Birth Control/ Protection: Surgical     Comment: Tubal Ligation   Other Topics Concern  . Not on file   Social History Narrative  :  Pertinent items are noted in HPI.  Exam: '@IPVITALS'$ @ Well-developed and well-nourished white female in no obvious distress. Vital signs show temperature of 98.2. Pulse  is 78. Blood pressure 97/62. Weight is 133 pounds. Head and neck exam shows no ocular or oral lesions.she has no adenopathy in the neck. She has no palpable thyroid. Lungs are clear. Cardiac exam regular rate and rhythm with no murmurs, rubs or bruits.abdomen is soft. She has good bowel sounds. There is no fluid wave. There is no palpable liver or spleen tip. Back exam shows no tenderness over the spine, ribs or hips. Examination shows no clubbing, cyanosis or edema. Skin exam shows no rashes, ecchymoses or petechia. Neurological exam shows no focal neurological deficits.   Recent Labs  03/24/15 1500  WBC 4.4  HGB 12.7  HCT 38.7  PLT 177   No results for input(s): NA, K, CL, CO2,  GLUCOSE, BUN, CREATININE, CALCIUM in the last 72 hours.  Blood smear review:  Normochromic and normocytic population of her red blood cells. She has no nucleated red blood cells. There are no teardrop cells. I see no schistocytes or spherocytes. There is no target cells. I see no inclusion bodies. White cells are normal in morphology maturation. I see nohypersegmentedon polys. She has no immature myeloid or lymphoid cells.platelets are adequate in number and size.   Pathology:      Assessment and Plan:  [name] is a 55 year old white female with transient leukopenia. I have to believe that this probably is somehow reflective of this urinary tract issue that she has. Sometimes with these chronic low-grade urinary tract infections, the white cells can the lower side.  There is no hair on her physical exam or on her blood smear looks suspicious for any hematologic issue.  I don't see anything that suggest myelodysplasia.  I do not see any indication for a bone marrow biopsy.  For now, I do still think we have to get her back. Everything looked okay on her physical exam and on her blood smear. It would not surprise me if she has transient leukopenia. Again, I think that having this chronic low-grade urinary tract issue could be the source of the leukopenia.  I spent about 45 minutes with her. She is very nice. It was fun talking to her about [place].

## 2015-04-07 NOTE — Telephone Encounter (Signed)
Notes Recorded by Joeseph Amorracy L Samarion Ehle, RN on 03/02/2015 at 1:37 PM Patient notified of results. Referral placed for [facility]

## 2015-12-29 NOTE — Progress Notes (Signed)
55 y.o. 373P3003 Married Caucasian female here for annual exam.    Did see Dr. Myna HidalgoEnnever regarding low WBC.  This normalized.    Feels something poking in her left upper ribs. Wonders if it is dyspepsia.  Received Rx for Protonix and did not use it after she read about it.  Doing dietary modification.   No menses. FSH and E2 showed menopause in 2016.  Used vaginal estrogen and it was too burdensome. Using coconut oil now.   Using trimethoprim for UTI prophylaxis. PCP prescribing.  Not using Macrobid any longer due to expense.   Son applying for orthopedics residency.   PCP: Deboraha SprangEagle @ [facility]    Patient's last menstrual period was 10/20/2014 (exact date).           Sexually active: Yes.   female The current method of family planning is tubal ligation.    Exercising: No.   Smoker:  no  Health Maintenance: Pap:  12-21-14 Neg:Neg HR HPV History of abnormal Pap:  Yes, 1994 hx of cryotherapy to cervix. MMG:  12-30-14 Density D/benign scattered calcifications both breasts/Neg/BiRads2:[facility].  She will schedule. Colonoscopy:  2016 normal with Dr. Thelma CompMann;next 2026. BMD:   n/a  Result  n/a TDaP:  ?10 years ago--pt. Will check her record at home Gardasil:   N/A Hep C:  Declines. Screening Labs:    Urine today: Neg   reports that she has never smoked. She has never used smokeless tobacco. She reports that she drinks about 1.8 oz of alcohol per week . She reports that she does not use drugs.  Past Medical History:  Diagnosis Date  . Chronic UTI   . Depression   . Frequent UTI    --sees Dr.McDiarmid  . Hypertrophy of uterus   . Leukopenia 2016   WBC 3.8  . Polyp of corpus uteri   . Uterine prolapse     Past Surgical History:  Procedure Laterality Date  . CHOLECYSTECTOMY  1998  . TUBAL LIGATION  1994    Current Outpatient Prescriptions  Medication Sig Dispense Refill  . citalopram (CELEXA) 20 MG tablet Take 20 mg by mouth at bedtime.  0  . trimethoprim (TRIMPEX) 100 MG tablet  Take 100 mg by mouth daily.     Marland Kitchen. conjugated estrogens (PREMARIN) vaginal cream Use 1/2 g vaginally every night at bed time for the first 2 weeks, then use 1/2 g vaginally two times per week . 60 g 1  . eletriptan (RELPAX) 20 MG tablet Take 1 tablet by mouth as needed.     No current facility-administered medications for this visit.     Family History  Problem Relation Age of Onset  . Hypertension Mother   . Neuropathy Mother   . Osteoarthritis Mother   . Hypertension Father   . Osteoarthritis Father     ROS:  Pertinent items are noted in HPI.  Otherwise, a comprehensive ROS was negative.  Exam:   BP 100/64 (BP Location: Right Arm, Patient Position: Sitting, Cuff Size: Normal)   Pulse 70   Resp 14   Ht 5\' 7"  (1.702 m)   Wt 133 lb (60.3 kg)   LMP 10/20/2014 (Exact Date)   BMI 20.83 kg/m     General appearance: alert, cooperative and appears stated age Head: Normocephalic, without obvious abnormality, atraumatic Neck: no adenopathy, supple, symmetrical, trachea midline and thyroid normal to inspection and palpation Lungs: clear to auscultation bilaterally Breasts: normal appearance, no masses or tenderness, No nipple retraction or dimpling, No  nipple discharge or bleeding, No axillary or supraclavicular adenopathy Heart: regular rate and rhythm Abdomen: soft, non-tender; no masses, no organomegaly Extremities: extremities normal, atraumatic, no cyanosis or edema Skin: Skin color, texture, turgor normal. No rashes or lesions Lymph nodes: Cervical, supraclavicular, and axillary nodes normal. No abnormal inguinal nodes palpated Neurologic: Grossly normal  Pelvic: External genitalia:  no lesions              Urethra:  normal appearing urethra with no masses, tenderness or lesions              Bartholins and Skenes: normal                 Vagina:  Moderate vaginal atrophy noted.               Cervix: no lesions              Pap taken: No. Bimanual Exam:  Uterus:  normal size,  contour, position, consistency, mobility, non-tender              Adnexa: no mass, fullness, tenderness              Rectal exam: Yes.  .  Confirms.              Anus:  normal sphincter tone, no lesions  Chaperone was present for exam.  Assessment:   Well woman visit with normal exam. Menopausal symptoms.  Tolerable. Vaginal atrophy.  UTIs controlled with Trimethoprim.  Dyspepsia.  Hx leukopenia.  Resolved.   Plan: Yearly mammogram recommended after age 55. She will schedule her mammogram.  Recommended self breast exam.  Pap and HR HPV as above. Discussed Calcium, Vitamin D, regular exercise program including cardiovascular and weight bearing exercise. Routine labs.  Rx for Premarin vaginal cream.  She will follow up with GI.  She is established so she will call to schedule this appt. Follow up annually and prn.       After visit summary provided.

## 2015-12-30 ENCOUNTER — Ambulatory Visit (INDEPENDENT_AMBULATORY_CARE_PROVIDER_SITE_OTHER): Payer: 59 | Admitting: Obstetrics and Gynecology

## 2015-12-30 ENCOUNTER — Encounter: Payer: Self-pay | Admitting: Obstetrics and Gynecology

## 2015-12-30 VITALS — BP 100/64 | HR 70 | Resp 14 | Ht 67.0 in | Wt 133.0 lb

## 2015-12-30 DIAGNOSIS — Z01419 Encounter for gynecological examination (general) (routine) without abnormal findings: Secondary | ICD-10-CM | POA: Diagnosis not present

## 2015-12-30 LAB — COMPREHENSIVE METABOLIC PANEL
ALT: 13 U/L (ref 6–29)
AST: 21 U/L (ref 10–35)
Albumin: 4.5 g/dL (ref 3.6–5.1)
Alkaline Phosphatase: 44 U/L (ref 33–130)
BUN: 10 mg/dL (ref 7–25)
CO2: 29 mmol/L (ref 20–31)
Calcium: 9.4 mg/dL (ref 8.6–10.4)
Chloride: 102 mmol/L (ref 98–110)
Creat: 0.73 mg/dL (ref 0.50–1.05)
Glucose, Bld: 91 mg/dL (ref 65–99)
Potassium: 3.7 mmol/L (ref 3.5–5.3)
Sodium: 139 mmol/L (ref 135–146)
Total Bilirubin: 0.8 mg/dL (ref 0.2–1.2)
Total Protein: 6.8 g/dL (ref 6.1–8.1)

## 2015-12-30 LAB — CBC WITH DIFFERENTIAL/PLATELET
Basophils Absolute: 39 cells/uL (ref 0–200)
Basophils Relative: 1 %
Eosinophils Absolute: 117 cells/uL (ref 15–500)
Eosinophils Relative: 3 %
HCT: 39.7 % (ref 35.0–45.0)
Hemoglobin: 12.9 g/dL (ref 11.7–15.5)
Lymphocytes Relative: 33 %
Lymphs Abs: 1287 cells/uL (ref 850–3900)
MCH: 30.1 pg (ref 27.0–33.0)
MCHC: 32.5 g/dL (ref 32.0–36.0)
MCV: 92.5 fL (ref 80.0–100.0)
MPV: 10.3 fL (ref 7.5–12.5)
Monocytes Absolute: 312 cells/uL (ref 200–950)
Monocytes Relative: 8 %
Neutro Abs: 2145 cells/uL (ref 1500–7800)
Neutrophils Relative %: 55 %
Platelets: 201 10*3/uL (ref 140–400)
RBC: 4.29 MIL/uL (ref 3.80–5.10)
RDW: 13.4 % (ref 11.0–15.0)
WBC: 3.9 10*3/uL (ref 3.8–10.8)

## 2015-12-30 LAB — LIPID PANEL
Cholesterol: 196 mg/dL (ref <200)
HDL: 97 mg/dL (ref >50)
LDL Cholesterol: 81 mg/dL (ref <100)
Total CHOL/HDL Ratio: 2 Ratio (ref <5.0)
Triglycerides: 88 mg/dL (ref <150)
VLDL: 18 mg/dL (ref <30)

## 2015-12-30 LAB — TSH: TSH: 0.53 mIU/L

## 2015-12-30 MED ORDER — ESTROGENS, CONJUGATED 0.625 MG/GM VA CREA: TOPICAL_CREAM | VAGINAL | 1 refills | Status: DC

## 2015-12-30 NOTE — Patient Instructions (Addendum)

## 2015-12-31 LAB — VITAMIN D 25 HYDROXY (VIT D DEFICIENCY, FRACTURES): Vit D, 25-Hydroxy: 30 ng/mL (ref 30–100)

## 2016-01-11 ENCOUNTER — Ambulatory Visit: Payer: 59 | Admitting: Obstetrics and Gynecology

## 2016-02-20 DIAGNOSIS — Z1231 Encounter for screening mammogram for malignant neoplasm of breast: Secondary | ICD-10-CM | POA: Diagnosis not present

## 2016-03-07 ENCOUNTER — Encounter: Payer: Self-pay | Admitting: Obstetrics and Gynecology

## 2016-05-25 DIAGNOSIS — Z792 Long term (current) use of antibiotics: Secondary | ICD-10-CM | POA: Diagnosis not present

## 2016-05-25 DIAGNOSIS — G43909 Migraine, unspecified, not intractable, without status migrainosus: Secondary | ICD-10-CM | POA: Diagnosis not present

## 2016-09-20 DIAGNOSIS — R5383 Other fatigue: Secondary | ICD-10-CM | POA: Diagnosis not present

## 2016-09-20 DIAGNOSIS — M791 Myalgia: Secondary | ICD-10-CM | POA: Diagnosis not present

## 2016-12-25 DIAGNOSIS — M26609 Unspecified temporomandibular joint disorder, unspecified side: Secondary | ICD-10-CM | POA: Diagnosis not present

## 2016-12-25 DIAGNOSIS — M542 Cervicalgia: Secondary | ICD-10-CM | POA: Diagnosis not present

## 2016-12-25 DIAGNOSIS — M5412 Radiculopathy, cervical region: Secondary | ICD-10-CM | POA: Diagnosis not present

## 2017-01-02 ENCOUNTER — Other Ambulatory Visit: Payer: Self-pay

## 2017-01-02 ENCOUNTER — Encounter: Payer: Self-pay | Admitting: Obstetrics and Gynecology

## 2017-01-02 ENCOUNTER — Ambulatory Visit (INDEPENDENT_AMBULATORY_CARE_PROVIDER_SITE_OTHER): Payer: 59 | Admitting: Obstetrics and Gynecology

## 2017-01-02 VITALS — BP 104/64 | HR 70 | Resp 14 | Ht 67.0 in | Wt 131.0 lb

## 2017-01-02 DIAGNOSIS — Z01419 Encounter for gynecological examination (general) (routine) without abnormal findings: Secondary | ICD-10-CM

## 2017-01-02 DIAGNOSIS — M542 Cervicalgia: Secondary | ICD-10-CM | POA: Diagnosis not present

## 2017-01-02 DIAGNOSIS — M26609 Unspecified temporomandibular joint disorder, unspecified side: Secondary | ICD-10-CM | POA: Diagnosis not present

## 2017-01-02 DIAGNOSIS — M5412 Radiculopathy, cervical region: Secondary | ICD-10-CM | POA: Diagnosis not present

## 2017-01-02 NOTE — Progress Notes (Signed)
56 y.o. [id number] Married Caucasian female here for annual exam.    No bleeding or spotting.  Some hot flashes.  Using coconut oil.   Had a rash under her arm and having muscle aching.  Saw her PCP for evaluation. She had a low WBC.   Depression. On Celexa.   [place] 35[place].   PCP: Mila PalmerSharon Wolters, MD  Patient's last menstrual period was 10/20/2014 (exact date).           Sexually active: Yes.   female The current method of family planning is tubal ligation.    Exercising: No.   Smoker:  no   Health Maintenance: Pap: 12-21-14 Neg:Neg HR HPV,  History of abnormal Pap:  Yes, 1994 hx of cryotherapy to cervix. MMG: 02-20-16 3D Density D--extremely dense/Neg/BiRads2:[facility] Colonoscopy:  2016 normal with Dr. Thelma CompMann;next 2026. BMD:   n/a  Result  n/a TDaP: Pt.to check with PCP Gardasil:  no XLK:GMWNUHIV:never Hep C:Declines Screening Labs:  Hb today: PCP, Urine today: not done   reports that  has never smoked. she has never used smokeless tobacco. She reports that she drinks about 1.8 oz of alcohol per week. She reports that she does not use drugs.  Past Medical History:  Diagnosis Date  . Chronic UTI   . Depression   . Frequent UTI    --sees Dr.McDiarmid  . Hypertrophy of uterus   . Leukopenia 2016   WBC 3.8  . Polyp of corpus uteri   . Uterine prolapse     Past Surgical History:  Procedure Laterality Date  . CHOLECYSTECTOMY  1998  . TUBAL LIGATION  1994    Current Outpatient Medications  Medication Sig Dispense Refill  . citalopram (CELEXA) 20 MG tablet Take 20 mg by mouth at bedtime.  0  . trimethoprim (TRIMPEX) 100 MG tablet Take 100 mg by mouth daily.     Marland Kitchen. conjugated estrogens (PREMARIN) vaginal cream Use 1/2 g vaginally every night at bed time for the first 2 weeks, then use 1/2 g vaginally two times per week . (Patient not taking: Reported on 01/02/2017) 60 g 1   No current facility-administered medications for this visit.     Family  History  Problem Relation Age of Onset  . Hypertension Mother   . Neuropathy Mother   . Osteoarthritis Mother   . Hypertension Father   . Osteoarthritis Father     ROS:  Pertinent items are noted in HPI.  Otherwise, a comprehensive ROS was negative.  Exam:   BP 104/64 (BP Location: Right Arm, Patient Position: Sitting, Cuff Size: Normal)   Pulse 70   Resp 14   Ht 5\' 7"  (1.702 m)   Wt 131 lb (59.4 kg)   LMP 10/20/2014 (Exact Date)   BMI 20.52 kg/m     General appearance: alert, cooperative and appears stated age Head: Normocephalic, without obvious abnormality, atraumatic Neck: no adenopathy, supple, symmetrical, trachea midline and thyroid normal to inspection and palpation Lungs: clear to auscultation bilaterally Breasts: normal appearance, no masses or tenderness, No nipple retraction or dimpling, No nipple discharge or bleeding, No axillary or supraclavicular adenopathy Heart: regular rate and rhythm Abdomen: soft, non-tender; no masses, no organomegaly Extremities: extremities normal, atraumatic, no cyanosis or edema Skin: Skin color, texture, turgor normal. No rashes or lesions Lymph nodes: Cervical, supraclavicular, and axillary nodes normal. No abnormal inguinal nodes palpated Neurologic: Grossly normal  Pelvic: External genitalia:  no lesions  Urethra:  normal appearing urethra with no masses, tenderness or lesions              Bartholins and Skenes: normal                 Vagina: normal appearing vagina with normal color and discharge, no lesions              Cervix: no lesions              Pap taken: No. Bimanual Exam:  Uterus:  normal size, contour, position, consistency, mobility, non-tender              Adnexa: no mass, fullness, tenderness              Rectal exam: Yes.  .  Confirms.              Anus:  normal sphincter tone, no lesions  Chaperone was present for exam.  Assessment:   Well woman visit with normal exam. Menopausal symptoms.   Tolerable. Vaginal atrophy.  Using coconut oil.  UTIs controlled with Trimethoprim.  Dyspepsia.  Hx leukopenia.   Muscle aching and malaise.   Plan: Mammogram screening discussed.  Discussed 3D.  Recommended self breast awareness. Pap and HR HPV as above. Guidelines for Calcium, Vitamin D, regular exercise program including cardiovascular and weight bearing exercise. I discussed seeing a rheumatologist.   She also asked for information for PCPs.  I gave her a list.  Follow up annually and prn.   After visit summary provided.

## 2017-01-02 NOTE — Patient Instructions (Signed)

## 2017-01-15 DIAGNOSIS — Z0000 Encounter for general adult medical examination without abnormal findings: Secondary | ICD-10-CM | POA: Diagnosis not present

## 2017-01-15 DIAGNOSIS — Z13220 Encounter for screening for lipoid disorders: Secondary | ICD-10-CM | POA: Diagnosis not present

## 2017-03-04 DIAGNOSIS — Z1231 Encounter for screening mammogram for malignant neoplasm of breast: Secondary | ICD-10-CM | POA: Diagnosis not present

## 2017-03-12 DIAGNOSIS — N6012 Diffuse cystic mastopathy of left breast: Secondary | ICD-10-CM | POA: Diagnosis not present

## 2017-03-12 DIAGNOSIS — N6489 Other specified disorders of breast: Secondary | ICD-10-CM | POA: Diagnosis not present

## 2017-05-13 DIAGNOSIS — M791 Myalgia, unspecified site: Secondary | ICD-10-CM | POA: Diagnosis not present

## 2017-05-13 DIAGNOSIS — R52 Pain, unspecified: Secondary | ICD-10-CM | POA: Diagnosis not present

## 2017-05-13 DIAGNOSIS — M533 Sacrococcygeal disorders, not elsewhere classified: Secondary | ICD-10-CM | POA: Diagnosis not present

## 2017-05-13 DIAGNOSIS — R202 Paresthesia of skin: Secondary | ICD-10-CM | POA: Diagnosis not present

## 2017-05-14 DIAGNOSIS — M5412 Radiculopathy, cervical region: Secondary | ICD-10-CM | POA: Diagnosis not present

## 2017-05-14 DIAGNOSIS — M542 Cervicalgia: Secondary | ICD-10-CM | POA: Diagnosis not present

## 2017-05-14 DIAGNOSIS — M26609 Unspecified temporomandibular joint disorder, unspecified side: Secondary | ICD-10-CM | POA: Diagnosis not present

## 2017-05-20 DIAGNOSIS — M26609 Unspecified temporomandibular joint disorder, unspecified side: Secondary | ICD-10-CM | POA: Diagnosis not present

## 2017-05-20 DIAGNOSIS — M542 Cervicalgia: Secondary | ICD-10-CM | POA: Diagnosis not present

## 2017-05-20 DIAGNOSIS — M5412 Radiculopathy, cervical region: Secondary | ICD-10-CM | POA: Diagnosis not present

## 2017-06-17 DIAGNOSIS — L814 Other melanin hyperpigmentation: Secondary | ICD-10-CM | POA: Diagnosis not present

## 2017-07-05 NOTE — Progress Notes (Signed)
 Office Visit Note  Patient: Maureen Davis             Date of Birth: 08/16/1960           MRN: [id number]             PCP: Wolters, Sharon, MD Referring: Wolters, Sharon, MD Visit Date: 07/18/2017 Occupation: Sales person for [employer]    Subjective:  Pain in multiple joints and muscles.   History of Present Illness: Maureen Davis is a 56 y.o. female seen in consultation per request of Dr. Walters.  According to patient her symptoms started back in April 2018 with soreness in her coccyx area and stiffness in her neck.  She after that she went on a trip in May and her symptoms eased off.  She states she came back home and she worked in her yard after her yard was sprayed.  She started having some aching sensation.  In August 2018 she developed a rash under her left arm.  The rash gradually resolved.  In November and December she started going to a chiropractor due to some paresthesias in her lower extremities and the pain in her tailbone.  She states the nerve tingling sensation improved after going to the chiropractor but the tailbone pain persist.  In December she was seen again by her PCP and had some labs which were unremarkable.  As her symptoms persisted in April 2019 she had Lyme test which was negative.  She continued to see the chiropractor.  In May 2019 for the recurrent UTI she took some trimethoprim and after that she felt much better with all her symptoms resolved.  Gradually the symptoms return.  She continues to have neck is stiffness pain in her tailbone, bilateral elbows, bilateral knee joints and her feet.  She also feels lightheaded dizzy and has ringing sensation in her ears.  Activities of Daily Living:  Patient reports morning stiffness for 0 minutes.   Patient Reports nocturnal pain.  Difficulty dressing/grooming: Denies Difficulty climbing stairs: Reports Difficulty getting out of chair: Denies Difficulty using hands for taps, buttons, cutlery, and/or writing:  Denies   Review of Systems  Constitutional: Positive for fatigue. Negative for night sweats, weight gain and weight loss.  HENT: Negative for mouth sores, trouble swallowing, trouble swallowing, mouth dryness and nose dryness.   Eyes: Negative for pain, redness, visual disturbance and dryness.  Respiratory: Negative for cough, shortness of breath and difficulty breathing.   Cardiovascular: Negative for chest pain, palpitations, hypertension, irregular heartbeat and swelling in legs/feet.  Gastrointestinal: Negative for abdominal pain, blood in stool, constipation and diarrhea.  Endocrine: Positive for increased urination.  Genitourinary: Positive for pelvic pain. Negative for vaginal dryness.  Musculoskeletal: Positive for arthralgias, joint pain and joint swelling. Negative for myalgias, muscle weakness, morning stiffness, muscle tenderness and myalgias.  Skin: Negative for color change, rash, hair loss, skin tightness, ulcers and sensitivity to sunlight.  Allergic/Immunologic: Negative for susceptible to infections.  Neurological: Positive for dizziness, light-headedness, headaches and memory loss. Negative for night sweats and weakness.  Hematological: Negative for bruising/bleeding tendency and swollen glands.  Psychiatric/Behavioral: Negative for depressed mood, confusion and sleep disturbance. The patient is not nervous/anxious.     PMFS History:  Patient Active Problem List   Diagnosis Date Noted  . Hx of migraines 07/18/2017  . History of gastroesophageal reflux (GERD) 07/18/2017  . History of depression 07/18/2017  . Recurrent UTI 07/18/2017  . Leukopenia 07/18/2017    Past Medical History:    Diagnosis Date  . Chronic UTI   . Depression   . Frequent UTI    --sees Dr.McDiarmid  . Hypertrophy of uterus   . Leukopenia 2016   WBC 3.8  . Polyp of corpus uteri   . Uterine prolapse     Family History  Problem Relation Age of Onset  . Neuropathy Mother   .  Charcot-Marie-Tooth disease Mother   . Hypertension Father   . Osteoarthritis Father   . Thyroid disease Sister        removed   . Healthy Son   . Healthy Son   . Healthy Son    Past Surgical History:  Procedure Laterality Date  . CHOLECYSTECTOMY  1998  . TUBAL LIGATION  1994   Social History   Social History Narrative  . Not on file     Objective: Vital Signs: BP 108/67 (BP Location: Left Arm, Patient Position: Sitting, Cuff Size: Normal)   Pulse 69   Resp 12   Ht 5' 7" (1.702 m)   Wt 130 lb (59 kg)   LMP 10/20/2014 (Exact Date)   BMI 20.36 kg/m    Physical Exam  Constitutional: She is oriented to person, place, and time. She appears well-developed and well-nourished.  HENT:  Head: Normocephalic and atraumatic.  Eyes: Conjunctivae and EOM are normal.  Neck: Normal range of motion.  Cardiovascular: Normal rate, regular rhythm, normal heart sounds and intact distal pulses.  Pulmonary/Chest: Effort normal and breath sounds normal.  Abdominal: Soft. Bowel sounds are normal.  Lymphadenopathy:    She has no cervical adenopathy.  Neurological: She is alert and oriented to person, place, and time.  Skin: Skin is warm and dry. Capillary refill takes less than 2 seconds.  Psychiatric: She has a normal mood and affect. Her behavior is normal.  Nursing note and vitals reviewed.    Musculoskeletal Exam: C-spine limited range of motion with some discomfort.  She has some tenderness over right SI joint.  Thoracic and lumbar spine good range of motion.  Shoulder joints elbow joints wrist joint MCPs PIPs DIPs are in good range of motion.  She has some thickening of PIP DIP joints in her hands and feet.  She has tenderness over right lateral epicondyle.  Hip joints knee joints ankles MTPs PIPs were in good range of motion.  She does have bilateral dorsal spurs.  She has tenderness over her coccyx.   CDAI Exam: No CDAI exam completed.    Investigation: Findings:  05/13/17: Sed  rate 7, B12 567, CPK 126, Lyme negative, TSH 0.8 September 20, 2016 W BC 3.0 hemoglobin 13.0 platelets 167, vitamin D low at 28.4, CRP normal my ESR 7    Imaging: Xr Sacrum/coccyx  Result Date: 07/18/2017 Unremarkable x-ray of sacrum and coccyx.  Xr Cervical Spine 2 Or 3 Views  Result Date: 07/18/2017 C4-5 narrowing was noted.  Mild spondylosis was noted. These findings are consistent with DDD of the cervical spine.  Xr Pelvis 1-2 Views  Result Date: 07/18/2017 No SI joint narrowing or sclerosis was noted.   Speciality Comments: No specialty comments available.    Procedures:  No procedures performed Allergies: Patient has no known allergies.   Assessment / Plan:     Visit Diagnoses: Myalgia -she had no muscular weakness or tenderness on examination.  Her CK was normal.  05/13/17: Sed rate 7, B12 567, CPK 126, Lyme negative, TSH 0.8  Neck pain -she has limited range of motion of her cervical spine with  discomfort.  Plan: XR Cervical Spine 2 or 3 views.  The cervical spine x-ray showed C4-5 narrowing.  She is not having any radiculopathy.  We will see response to physical therapy.  If she has ongoing discomfort she should notify us and we can schedule MRI of the cervical spine.  Coccygodynia -she has history of coccygodynia for many years.  She has been through chiropractic care without much relief.  Plan: XR Sacrum/Coccyx.  I will refer to physical therapy for coccygodynia.  Chronic SI joint pain -she has discomfort in her right SI joint.  Plan: XR Pelvis 1-2 Views.  The SI joint x-rays were unremarkable.  Leukopenia, unspecified type  Hx of migraines  History of gastroesophageal reflux (GERD)  History of depression  Recurrent UTI    Orders: Orders Placed This Encounter  Procedures  . XR Sacrum/Coccyx  . XR Pelvis 1-2 Views  . XR Cervical Spine 2 or 3 views  . Ambulatory referral to Physical Therapy   No orders of the defined types were placed in this  encounter.   Face-to-face time spent with patient was 50 minutes. >50% of time was spent in counseling and coordination of care.  Follow-Up Instructions: Return if symptoms worsen or fail to improve, for DDD, coccygodynia.   Shaili Deveshwar, MD  Note - This record has been created using Dragon software.  Chart creation errors have been sought, but may not always  have been located. Such creation errors do not reflect on  the standard of medical care. 

## 2017-07-18 ENCOUNTER — Ambulatory Visit (INDEPENDENT_AMBULATORY_CARE_PROVIDER_SITE_OTHER): Payer: Self-pay

## 2017-07-18 ENCOUNTER — Encounter: Payer: Self-pay | Admitting: Rheumatology

## 2017-07-18 ENCOUNTER — Ambulatory Visit (INDEPENDENT_AMBULATORY_CARE_PROVIDER_SITE_OTHER): Payer: 59 | Admitting: Rheumatology

## 2017-07-18 VITALS — BP 108/67 | HR 69 | Resp 12 | Ht 67.0 in | Wt 130.0 lb

## 2017-07-18 DIAGNOSIS — D72819 Decreased white blood cell count, unspecified: Secondary | ICD-10-CM

## 2017-07-18 DIAGNOSIS — N390 Urinary tract infection, site not specified: Secondary | ICD-10-CM | POA: Diagnosis not present

## 2017-07-18 DIAGNOSIS — M2550 Pain in unspecified joint: Secondary | ICD-10-CM | POA: Diagnosis not present

## 2017-07-18 DIAGNOSIS — R5383 Other fatigue: Secondary | ICD-10-CM | POA: Diagnosis not present

## 2017-07-18 DIAGNOSIS — G8929 Other chronic pain: Secondary | ICD-10-CM | POA: Diagnosis not present

## 2017-07-18 DIAGNOSIS — M791 Myalgia, unspecified site: Secondary | ICD-10-CM | POA: Diagnosis not present

## 2017-07-18 DIAGNOSIS — Z8669 Personal history of other diseases of the nervous system and sense organs: Secondary | ICD-10-CM

## 2017-07-18 DIAGNOSIS — Z8719 Personal history of other diseases of the digestive system: Secondary | ICD-10-CM | POA: Insufficient documentation

## 2017-07-18 DIAGNOSIS — M542 Cervicalgia: Secondary | ICD-10-CM

## 2017-07-18 DIAGNOSIS — Z8659 Personal history of other mental and behavioral disorders: Secondary | ICD-10-CM

## 2017-07-18 DIAGNOSIS — M533 Sacrococcygeal disorders, not elsewhere classified: Secondary | ICD-10-CM | POA: Diagnosis not present

## 2017-07-18 NOTE — Patient Instructions (Signed)
Natural anti-inflammatories  You can purchase these at [employer], [employer] or online.  . Turmeric (capsules)  . Ginger (ginger root or capsules)  . Omega 3 (Fish, flax seeds, chia seeds, walnuts, almonds)  . Tart cherry (dried or extract)   Patient should be under the care of a physician while taking these supplements. This may not be reproduced without the permission of Dr. Artice Bergerson.  

## 2017-07-23 NOTE — Progress Notes (Signed)
Please add ENA

## 2017-07-24 LAB — IGG, IGA, IGM
IgG (Immunoglobin G), Serum: 955 mg/dL (ref 694–1618)
IgM, Serum: 78 mg/dL (ref 48–271)
Immunoglobulin A: 137 mg/dL (ref 81–463)

## 2017-07-24 LAB — TEST AUTHORIZATION

## 2017-07-24 LAB — ANTI-NUCLEAR AB-TITER (ANA TITER): ANA Titer 1: 1:80 {titer} — ABNORMAL HIGH

## 2017-07-24 LAB — ANA: Anti Nuclear Antibody(ANA): POSITIVE — AB

## 2017-07-24 LAB — SJOGREN'S SYNDROME ANTIBODS(SSA + SSB)
SSA (Ro) (ENA) Antibody, IgG: <1 AI
SSB (La) (ENA) Antibody, IgG: <1 AI

## 2017-07-24 LAB — PROTEIN ELECTROPHORESIS, SERUM, WITH REFLEX
Albumin ELP: 4.6 g/dL (ref 3.8–4.8)
Alpha 1: 0.2 g/dL (ref 0.2–0.3)
Alpha 2: 0.7 g/dL (ref 0.5–0.9)
Beta 2: 0.2 g/dL (ref 0.2–0.5)
Beta Globulin: 0.5 g/dL (ref 0.4–0.6)
Gamma Globulin: 0.9 g/dL (ref 0.8–1.7)
Total Protein: 7.1 g/dL (ref 6.1–8.1)

## 2017-07-24 LAB — SM AND SM/RNP ANTIBODIES
ENA SM Ab Ser-aCnc: <1 AI
SM/RNP: <1 AI

## 2017-07-24 LAB — ANTI-DNA ANTIBODY, DOUBLE-STRANDED: ds DNA Ab: 1 IU/mL

## 2017-07-24 LAB — ANTI-SCLERODERMA ANTIBODY: Scleroderma (Scl-70) (ENA) Antibody, IgG: <1 AI

## 2017-07-24 NOTE — Progress Notes (Signed)
We will discuss at the follow-up visit.

## 2017-07-31 DIAGNOSIS — R102 Pelvic and perineal pain: Secondary | ICD-10-CM | POA: Diagnosis not present

## 2017-07-31 DIAGNOSIS — M6281 Muscle weakness (generalized): Secondary | ICD-10-CM | POA: Diagnosis not present

## 2017-07-31 DIAGNOSIS — M62838 Other muscle spasm: Secondary | ICD-10-CM | POA: Diagnosis not present

## 2017-08-07 DIAGNOSIS — M533 Sacrococcygeal disorders, not elsewhere classified: Secondary | ICD-10-CM | POA: Insufficient documentation

## 2017-08-07 DIAGNOSIS — M5030 Other cervical disc degeneration, unspecified cervical region: Secondary | ICD-10-CM | POA: Insufficient documentation

## 2017-08-07 NOTE — Progress Notes (Signed)
Office Visit Note  Patient: Maureen Davis             Date of Birth: 07-27-1960           MRN: [id number]             PCP: Jonathon Jordan, MD Referring: No ref. provider found Visit Date: 08/19/2017 Occupation: '@GUAROCC'$ @  Subjective:  Pain in joints and muscles.  History of Present Illness: Maureen Davis is a 57 y.o. female with history of myalgias.  She continues to have muscle pain and joint pain.  She states her neck continues to hurt.  The joint pain moves around into different joints.  The lower back pain persist.  She has been also having coccygeal pain which is been bothersome.  Activities of Daily Living:  Patient reports morning stiffness for 24 hours.   Patient Reports nocturnal pain.  Difficulty dressing/grooming: Denies Difficulty climbing stairs: Reports Difficulty getting out of chair: Denies Difficulty using hands for taps, buttons, cutlery, and/or writing: Denies  Review of Systems  Constitutional: Positive for fatigue.  HENT: Negative for mouth sores, trouble swallowing, trouble swallowing, mouth dryness and nose dryness.   Eyes: Negative for pain, visual disturbance and dryness.  Respiratory: Negative for cough, hemoptysis, shortness of breath and difficulty breathing.   Cardiovascular: Negative for chest pain, palpitations, hypertension and swelling in legs/feet.  Gastrointestinal: Negative for abdominal pain, blood in stool, constipation and diarrhea.  Endocrine: Negative for increased urination.  Genitourinary: Negative for painful urination and pelvic pain.  Musculoskeletal: Positive for arthralgias, joint pain and morning stiffness. Negative for joint swelling, myalgias, muscle weakness, muscle tenderness and myalgias.  Skin: Negative for color change, pallor, rash, hair loss, nodules/bumps, skin tightness, ulcers and sensitivity to sunlight.  Allergic/Immunologic: Negative for susceptible to infections.  Neurological: Negative for numbness and memory  loss.  Hematological: Negative for bruising/bleeding tendency and swollen glands.  Psychiatric/Behavioral: Negative for depressed mood, confusion and sleep disturbance. The patient is not nervous/anxious.     PMFS History:  Patient Active Problem List   Diagnosis Date Noted  . DDD (degenerative disc disease), cervical 08/07/2017  . Coccygodynia 08/07/2017  . Hx of migraines 07/18/2017  . History of gastroesophageal reflux (GERD) 07/18/2017  . History of depression 07/18/2017  . Recurrent UTI 07/18/2017  . Leukopenia 07/18/2017    Past Medical History:  Diagnosis Date  . Chronic UTI   . Depression   . Frequent UTI    --sees Dr.McDiarmid  . Hypertrophy of uterus   . Leukopenia 2016   WBC 3.8  . Polyp of corpus uteri   . Uterine prolapse     Family History  Problem Relation Age of Onset  . Neuropathy Mother   . Charcot-Marie-Tooth disease Mother   . Hypertension Father   . Osteoarthritis Father   . Thyroid disease Sister        removed   . Healthy Son   . Healthy Son   . Healthy Son    Past Surgical History:  Procedure Laterality Date  . CHOLECYSTECTOMY  1998  . TUBAL LIGATION  1994   Social History   Social History Narrative  . Not on file    Objective: Vital Signs: BP 101/66 (BP Location: Left Arm, Patient Position: Sitting, Cuff Size: Normal)   Pulse 69   Resp 14   Ht '5\' 7"'$  (1.702 m)   Wt 133 lb (60.3 kg)   LMP 10/20/2014 (Exact Date)   BMI 20.83 kg/m    Physical Exam  Constitutional: She is oriented to person, place, and time. She appears well-developed and well-nourished.  HENT:  Head: Normocephalic and atraumatic.  Eyes: Conjunctivae and EOM are normal.  Neck: Normal range of motion.  Cardiovascular: Normal rate, regular rhythm, normal heart sounds and intact distal pulses.  Pulmonary/Chest: Effort normal and breath sounds normal.  Abdominal: Soft. Bowel sounds are normal.  Lymphadenopathy:    She has no cervical adenopathy.  Neurological:  She is alert and oriented to person, place, and time.  Skin: Skin is warm and dry. Capillary refill takes less than 2 seconds.  Psychiatric: She has a normal mood and affect. Her behavior is normal.  Nursing note and vitals reviewed.    Musculoskeletal Exam: C-spine limited range of motion with discomfort.  Thoracic and lumbar spine good range of motion.  She is continues to have discomfort in the coccyx region.  Shoulder joints elbow joints wrist joint MCPs PIPs DIPs were in good range of motion with no swelling.  Hip joints knee joints ankles MTPs PIPs were in good range of motion with no swelling.  CDAI Exam: No CDAI exam completed.   Investigation: Findings:  Findings:  05/13/17: Sed rate 7, B12 567, CPK 126, Lyme negative, TSH 0.8 September 20, 2016 W BC 3.0 hemoglobin 13.0 platelets 167, vitamin D low at 28.4, CRP normal my ESR 7     July 26, 2017 ANA 1: 80 homogeneous,( Smith, RNP, Ro, La, dsDNA, SCL 70-) immunoglobulins normal, SPEP negative Imaging: No results found.  Recent Labs: Lab Results  Component Value Date   WBC 3.9 12/30/2015   HGB 12.9 12/30/2015   PLT 201 12/30/2015   NA 139 12/30/2015   K 3.7 12/30/2015   CL 102 12/30/2015   CO2 29 12/30/2015   GLUCOSE 91 12/30/2015   BUN 10 12/30/2015   CREATININE 0.73 12/30/2015   BILITOT 0.8 12/30/2015   ALKPHOS 44 12/30/2015   AST 21 12/30/2015   ALT 13 12/30/2015   PROT 7.1 07/18/2017   ALBUMIN 4.5 12/30/2015   CALCIUM 9.4 12/30/2015    Speciality Comments: No specialty comments available.  Procedures:  No procedures performed Allergies: Patient has no known allergies.   Assessment / Plan:     Visit Diagnoses: Myalgia-she continues to have generalized myalgias arthralgias and positive tender point.  Most likely she has myofascial pain syndrome.  She may benefit from physical therapy.  Need for regular exercise was emphasized.  Good sleep hygiene was discussed.  She will also start natural  anti-inflammatories.  Positive ANA (antinuclear antibody) - ANA 1:80 NH, ENA negative, complements normal.  She has no clinical features of autoimmune disease.  If she develops any new symptoms she is supposed to notify me.  DDD (degenerative disc disease), cervical-she has limited range of motion of the cervical spine.  She continues to have discomfort in her C-spine.  Coccygodynia-she is been having a lot of coccygeal pain.  She is going to physical therapy which is been helpful.  Chronic SI joint pain - Right SI joint pain.  X-rays unremarkable last visit.  History of gastroesophageal reflux (GERD)  History of depression  Hx of migraines  Leukopenia, unspecified type   Orders: No orders of the defined types were placed in this encounter.  No orders of the defined types were placed in this encounter.   Face-to-face time spent with patient was 30 minutes. Greater than 50% of time was spent in counseling and coordination of care.  Follow-Up Instructions: Return for Myalgia, DDD.  Bo Merino, MD  Note - This record has been created using Editor, commissioning.  Chart creation errors have been sought, but may not always  have been located. Such creation errors do not reflect on  the standard of medical care.

## 2017-08-19 ENCOUNTER — Ambulatory Visit (INDEPENDENT_AMBULATORY_CARE_PROVIDER_SITE_OTHER): Payer: 59 | Admitting: Rheumatology

## 2017-08-19 ENCOUNTER — Encounter: Payer: Self-pay | Admitting: Rheumatology

## 2017-08-19 VITALS — BP 101/66 | HR 69 | Resp 14 | Ht 67.0 in | Wt 133.0 lb

## 2017-08-19 DIAGNOSIS — M533 Sacrococcygeal disorders, not elsewhere classified: Secondary | ICD-10-CM | POA: Diagnosis not present

## 2017-08-19 DIAGNOSIS — Z8669 Personal history of other diseases of the nervous system and sense organs: Secondary | ICD-10-CM

## 2017-08-19 DIAGNOSIS — M791 Myalgia, unspecified site: Secondary | ICD-10-CM

## 2017-08-19 DIAGNOSIS — Z8719 Personal history of other diseases of the digestive system: Secondary | ICD-10-CM

## 2017-08-19 DIAGNOSIS — R768 Other specified abnormal immunological findings in serum: Secondary | ICD-10-CM | POA: Diagnosis not present

## 2017-08-19 DIAGNOSIS — M5030 Other cervical disc degeneration, unspecified cervical region: Secondary | ICD-10-CM

## 2017-08-19 DIAGNOSIS — Z8659 Personal history of other mental and behavioral disorders: Secondary | ICD-10-CM

## 2017-08-19 DIAGNOSIS — G8929 Other chronic pain: Secondary | ICD-10-CM

## 2017-08-19 DIAGNOSIS — D72819 Decreased white blood cell count, unspecified: Secondary | ICD-10-CM

## 2017-08-21 DIAGNOSIS — M62838 Other muscle spasm: Secondary | ICD-10-CM | POA: Diagnosis not present

## 2017-08-21 DIAGNOSIS — M6289 Other specified disorders of muscle: Secondary | ICD-10-CM | POA: Diagnosis not present

## 2017-08-21 DIAGNOSIS — M6281 Muscle weakness (generalized): Secondary | ICD-10-CM | POA: Diagnosis not present

## 2017-08-29 DIAGNOSIS — M6289 Other specified disorders of muscle: Secondary | ICD-10-CM | POA: Diagnosis not present

## 2017-08-29 DIAGNOSIS — M6281 Muscle weakness (generalized): Secondary | ICD-10-CM | POA: Diagnosis not present

## 2017-08-29 DIAGNOSIS — M62838 Other muscle spasm: Secondary | ICD-10-CM | POA: Diagnosis not present

## 2017-10-23 ENCOUNTER — Ambulatory Visit (INDEPENDENT_AMBULATORY_CARE_PROVIDER_SITE_OTHER): Payer: 59 | Admitting: Obstetrics and Gynecology

## 2017-10-23 VITALS — BP 110/62 | HR 64 | Resp 14 | Ht 67.5 in | Wt 128.0 lb

## 2017-10-23 DIAGNOSIS — G629 Polyneuropathy, unspecified: Secondary | ICD-10-CM | POA: Diagnosis not present

## 2017-10-23 DIAGNOSIS — Z820 Family history of epilepsy and other diseases of the nervous system: Secondary | ICD-10-CM | POA: Diagnosis not present

## 2017-10-23 DIAGNOSIS — R768 Other specified abnormal immunological findings in serum: Secondary | ICD-10-CM | POA: Diagnosis not present

## 2017-10-23 NOTE — Progress Notes (Signed)
GYNECOLOGY  VISIT   HPI: 57 y.o.   Married  Caucasian  female   [phone] with Patient's last menstrual period was 10/20/2014 (exact date).   here for   Going over labs from another provider.  Patient would like a referral to a neurologist or another rheumatologist for a second opinion.  She feels like her symptoms are progressive.  In August 2018 had a rash under her left arm and developed tingling in her extremities. Noticed body aches.  Patient concerned about Lyme Disease.  Having pain in her body - neck, joints, and is always moving around.   Saw Dr. Eustace Quail and was dx with myalgia consistent with myofascial pain syndrome.  Had a positive ANA.  Lyme disease testing negative.  Received recommendation to do some herbal therapies.   Patient feeling a sensation in her head like a Tens unit is on.   PCP told her she has osteoarthritis in her neck.  Has a pressure in her epigastric area. Increasing in strength.   PCP: Mila Palmer MD   GYNECOLOGIC HISTORY: Patient's last menstrual period was 10/20/2014 (exact date). Contraception:  Tubal Ligation  Menopausal hormone therapy:  Yes, Premarin  Last mammogram:   02/20/16 - BI-RADS2, cat D density.  Last pap smear:   12/21/14 Neg HrHPV Neg         OB History    Gravida  3   Para  3   Term  3   Preterm      AB      Living  3     SAB      TAB      Ectopic      Multiple      Live Births                 Patient Active Problem List   Diagnosis Date Noted  . DDD (degenerative disc disease), cervical 08/07/2017  . Coccygodynia 08/07/2017  . Hx of migraines 07/18/2017  . History of gastroesophageal reflux (GERD) 07/18/2017  . History of depression 07/18/2017  . Recurrent UTI 07/18/2017  . Leukopenia 07/18/2017    Past Medical History:  Diagnosis Date  . Chronic UTI   . Depression   . Frequent UTI    --sees Dr.McDiarmid  . Hypertrophy of uterus   . Leukopenia 2016   WBC 3.8  . Polyp of corpus  uteri   . Uterine prolapse     Past Surgical History:  Procedure Laterality Date  . CHOLECYSTECTOMY  1998  . TUBAL LIGATION  1994    Current Outpatient Medications  Medication Sig Dispense Refill  . citalopram (CELEXA) 20 MG tablet Take 20 mg by mouth at bedtime.  0  . trimethoprim (TRIMPEX) 100 MG tablet Take 100 mg by mouth daily.      No current facility-administered medications for this visit.      ALLERGIES: Patient has no known allergies.  Family History  Problem Relation Age of Onset  . Neuropathy Mother   . Charcot-Marie-Tooth disease Mother   . Hypertension Father   . Osteoarthritis Father   . Thyroid disease Sister        removed   . Healthy Son   . Healthy Son   . Healthy Son     Social History   Socioeconomic History  . Marital status: Married    Spouse name: Not on file  . Number of children: Not on file  . Years of education: Not on file  . Highest  education level: Not on file  Occupational History  . Not on file  Social Needs  . Financial resource strain: Not on file  . Food insecurity:    Worry: Not on file    Inability: Not on file  . Transportation needs:    Medical: Not on file    Non-medical: Not on file  Tobacco Use  . Smoking status: Never Smoker  . Smokeless tobacco: Never Used  Substance and Sexual Activity  . Alcohol use: Yes    Comment: occ  . Drug use: Never  . Sexual activity: Yes    Partners: Male    Birth control/protection: Surgical    Comment: Tubal Ligation  Lifestyle  . Physical activity:    Days per week: Not on file    Minutes per session: Not on file  . Stress: Not on file  Relationships  . Social connections:    Talks on phone: Not on file    Gets together: Not on file    Attends religious service: Not on file    Active member of club or organization: Not on file    Attends meetings of clubs or organizations: Not on file    Relationship status: Not on file  . Intimate partner violence:    Fear of current  or ex partner: Not on file    Emotionally abused: Not on file    Physically abused: Not on file    Forced sexual activity: Not on file  Other Topics Concern  . Not on file  Social History Narrative  . Not on file    Review of Systems  Genitourinary:       Loss of urine when coughing or sneezing  And Night urination   Musculoskeletal:       Swelling Muscle and joint pain   Skin: Positive for rash.    PHYSICAL EXAMINATION:    BP 110/62   Pulse 64   Resp 14   Ht 5' 7.5" (1.715 m)   Wt 128 lb (58.1 kg)   LMP 10/20/2014 (Exact Date)   BMI 19.75 kg/m     General appearance: alert, cooperative and appears stated age  ASSESSMENT   Neuropathy? Positive ANA.  FH Charcot Marie Tooth disease.  PLAN  Reviewed options of neurology referral versus second opinion in tertiary care setting.  Will make a referral to neurology at this time.    An After Visit Summary was printed and given to the patient.  __15____ minutes face to face time of which over 50% was spent in counseling and coordination of care.

## 2017-10-26 ENCOUNTER — Encounter: Payer: Self-pay | Admitting: Obstetrics and Gynecology

## 2017-11-13 ENCOUNTER — Encounter: Payer: Self-pay | Admitting: Obstetrics and Gynecology

## 2017-12-17 ENCOUNTER — Ambulatory Visit (INDEPENDENT_AMBULATORY_CARE_PROVIDER_SITE_OTHER): Payer: 59 | Admitting: Neurology

## 2017-12-17 ENCOUNTER — Encounter: Payer: Self-pay | Admitting: Neurology

## 2017-12-17 ENCOUNTER — Other Ambulatory Visit: Payer: Self-pay

## 2017-12-17 VITALS — BP 105/72 | HR 77 | Resp 14 | Ht 67.5 in | Wt 130.0 lb

## 2017-12-17 DIAGNOSIS — R202 Paresthesia of skin: Secondary | ICD-10-CM | POA: Diagnosis not present

## 2017-12-17 NOTE — Progress Notes (Signed)
Reason for visit: Paresthesia  Referring physician: Dr. Riley NearingSilva  Maureen Davis is a 57 y.o. female  History of present illness:  Ms. Maureen Davis is a 57 year old right-handed white female with a history of depression.  The patient has chronic mild low white blood cell counts, the most recent was 3.0.  The patient has been seen through Dr. Corliss Skainseveshwar from rheumatology for migratory arthralgias.  The patient has more persistent pain in the tailbone area.  She may have pain in the elbow or knee comes and goes.  Blood work testing has not shown an etiology of her symptoms.  The patient has had a normal vitamin B12 level, thyroid profile, sedimentation rate and C-reactive protein.  The ANA was positive, but in a low titer, 1/80.  The patient was not felt to have lupus.  She has also developed transient tingling sensations that started on the head and migrate down the body lasting only about 2 minutes with full clearing.  The patient may feel normal between episodes.  She denies any weakness of the extremities, she denies any difficulty controlling bowels or bladder.  She feels that there has been some mild alteration in balance as she has gotten older.  She does have a history of migraine headache that have become less frequent after she has gone through menopause.  The patient is sent to this office for further evaluation  Past Medical History:  Diagnosis Date  . Chronic UTI   . Depression   . Frequent UTI    --sees Dr.McDiarmid  . Hypertrophy of uterus   . Leukopenia 2016   WBC 3.8  . Polyp of corpus uteri   . Uterine prolapse     Past Surgical History:  Procedure Laterality Date  . CHOLECYSTECTOMY  1998  . TUBAL LIGATION  1994    Family History  Problem Relation Age of Onset  . Neuropathy Mother   . Charcot-Marie-Tooth disease Mother   . Hypertension Father   . Osteoarthritis Father   . Thyroid disease Sister        removed   . Healthy Son   . Healthy Son   . Healthy Son      Social history:  reports that she has never smoked. She has never used smokeless tobacco. She reports that she drinks alcohol. She reports that she does not use drugs.  Medications:  Prior to Admission medications   Medication Sig Start Date End Date Taking? Authorizing Provider  citalopram (CELEXA) 20 MG tablet Take 20 mg by mouth at bedtime. 12/22/13  Yes [provider]  trimethoprim (TRIMPEX) 100 MG tablet Take 100 mg by mouth daily.    Yes [provider]     No Known Allergies  ROS:  Out of a complete 14 system review of symptoms, the patient complains only of the following symptoms, and all other reviewed systems are negative.  Chills, fatigue Ringing in the ears Skin rash Feeling hot, increased thirst Joint pain, joint swelling, muscle cramps, aching muscles Headache Depression  Blood pressure 105/72, pulse 77, resp. rate 14, height 5' 7.5" (1.715 m), weight 130 lb (59 kg), last menstrual period 10/20/2014.  Physical Exam  General: The patient is alert and cooperative at the time of the examination.  Eyes: Pupils are equal, round, and reactive to light. Discs are flat bilaterally.  Neck: The neck is supple, no carotid bruits are noted.  Respiratory: The respiratory examination is clear.  Cardiovascular: The cardiovascular examination reveals a regular rate and  rhythm, no obvious murmurs or rubs are noted.  Skin: Extremities are without significant edema.  Neurologic Exam  Mental status: The patient is alert and oriented x 3 at the time of the examination. The patient has apparent normal recent and remote memory, with an apparently normal attention span and concentration ability.  Cranial nerves: Facial symmetry is present. There is good sensation of the face to pinprick and soft touch bilaterally. The strength of the facial muscles and the muscles to head turning and shoulder shrug are normal bilaterally. Speech is well enunciated, no aphasia  or dysarthria is noted. Extraocular movements are full. Visual fields are full. The tongue is midline, and the patient has symmetric elevation of the soft palate. No obvious hearing deficits are noted.  Motor: The motor testing reveals 5 over 5 strength of all 4 extremities. Good symmetric motor tone is noted throughout.  Sensory: Sensory testing is intact to pinprick, soft touch, vibration sensation, and position sense on all 4 extremities. No evidence of extinction is noted.  Coordination: Cerebellar testing reveals good finger-nose-finger and heel-to-shin bilaterally.  Gait and station: Gait is normal. Tandem gait is normal. Romberg is negative. No drift is seen.  Reflexes: Deep tendon reflexes are symmetric and normal bilaterally. Toes are downgoing bilaterally.   Assessment/Plan:  1.  Migratory paresthesias, likely benign  2.  Migratory arthralgias  The patient is having mainly migratory symptoms, she may have some muscular discomfort in the anterior thighs at times as well.  The etiology is not clear, the patient has been evaluated through rheumatology.  She will be sent for further blood work.  If symptoms become more persistent, she will contact our office.  She may try altering her diet, potentially trying a gluten-free diet for several months to see if this improves symptoms.  Marlan Palau MD 12/17/2017 1:58 PM  [facility] [address] [place], [place] [address]  Phone [phone] Fax [phone]

## 2017-12-19 LAB — SEDIMENTATION RATE: Sed Rate: 4 mm/hr (ref 0–40)

## 2017-12-19 LAB — COPPER, SERUM: Copper: 105 ug/dL (ref 72–166)

## 2017-12-19 LAB — GLIADIN ANTIBODIES, SERUM
Antigliadin Abs, IgA: 3 units (ref 0–19)
Gliadin IgG: 6 units (ref 0–19)

## 2017-12-19 LAB — ANGIOTENSIN CONVERTING ENZYME: Angio Convert Enzyme: 63 U/L (ref 14–82)

## 2018-01-09 NOTE — Progress Notes (Signed)
57 y.o. [id number] Married Caucasian female here for annual exam.    Incontinence with urgency  Double voids.  No consistent stress incontinence.   Saw Dr. Anne HahnWillis from neurology about migratory paresthesias and arthralgias.  She is trying to control this through dietary means.   Wants Shingrix vaccine.   PCP: Mila PalmerSharon Wolters, MD    Patient's last menstrual period was 10/20/2014 (exact date).           Sexually active: Yes.   female The current method of family planning is tubal ligation.    Exercising: Yes.    Works out in gym Smoker:  no  Health Maintenance: Pap:  12-21-14 Neg:Neg HR HPV History of abnormal Pap:  Yes, 1994 hx of cryotherapy to cervix. MMG: 03-12-17 3D Diag.asymmetry Rt.Br.is benign;poss.mass in Lt.Br.--US Lt.Br.reveals benign cysts in Lt.Br/screening 2818yr/BiRads2 Colonoscopy: 2016 normal with Dr. Thelma CompMann;next 2026. BMD:   n/a  Result  n/a TDaP:  PCP Gardasil:   no ZOX:WRUEAHIV:never Hep C: declines Screening Labs:  Hb today: PCP   reports that she has never smoked. She has never used smokeless tobacco. She reports current alcohol use of about 2.0 - 3.0 standard drinks of alcohol per week. She reports that she does not use drugs.  Past Medical History:  Diagnosis Date  . Chronic UTI   . Depression   . Frequent UTI    --sees Dr.McDiarmid  . Hypertrophy of uterus   . Leukopenia 2016   WBC 3.8  . Polyp of corpus uteri   . Uterine prolapse     Past Surgical History:  Procedure Laterality Date  . CHOLECYSTECTOMY  1998  . TUBAL LIGATION  1994    Current Outpatient Medications  Medication Sig Dispense Refill  . citalopram (CELEXA) 20 MG tablet Take 20 mg by mouth at bedtime.  0   No current facility-administered medications for this visit.     Family History  Problem Relation Age of Onset  . Neuropathy Mother   . Charcot-Marie-Tooth disease Mother   . Hypertension Father   . Osteoarthritis Father   . Thyroid disease Sister        removed   . Healthy Son   .  Healthy Son   . Healthy Son     Review of Systems  Musculoskeletal:       Muscle and joint aches  All other systems reviewed and are negative.   Exam:   BP 100/60 (BP Location: Right Arm, Patient Position: Sitting, Cuff Size: Normal)   Pulse 70   Resp 14   Ht 5\' 7"  (1.702 m)   Wt 129 lb (58.5 kg)   LMP 10/20/2014 (Exact Date)   BMI 20.20 kg/m     General appearance: alert, cooperative and appears stated age Head: Normocephalic, without obvious abnormality, atraumatic Neck: no adenopathy, supple, symmetrical, trachea midline and thyroid normal to inspection and palpation Lungs: clear to auscultation bilaterally Breasts: normal appearance, no masses or tenderness, No nipple retraction or dimpling, No nipple discharge or bleeding, No axillary or supraclavicular adenopathy Heart: regular rate and rhythm Abdomen: soft, non-tender; no masses, no organomegaly Extremities: extremities normal, atraumatic, no cyanosis or edema Skin: Skin color, texture, turgor normal. No rashes or lesions Lymph nodes: Cervical, supraclavicular, and axillary nodes normal. No abnormal inguinal nodes palpated Neurologic: Grossly normal  Pelvic: External genitalia:  no lesions              Urethra:  normal appearing urethra with no masses, tenderness or lesions  Bartholins and Skenes: normal                 Vagina: normal appearing vagina with normal color and discharge, no lesions              Cervix: no lesions              Pap taken: Yes.   Bimanual Exam:  Uterus:  normal size, contour, position, consistency, mobility, non-tender              Adnexa: no mass, fullness, tenderness              Rectal exam: Yes.  .  Confirms.              Anus:  normal sphincter tone, no lesions  Chaperone was present for exam.  Assessment:   Well woman visit with normal exam. UTIs controlled with Trimethoprim. Hx leukopenia.   Muscle aching and malaise.  Urinary urgency.   Plan: Mammogram  screening. Recommended self breast awareness. Pap and HR HPV as above. Guidelines for Calcium, Vitamin D, regular exercise program including cardiovascular and weight bearing exercise. Shingrix vaccine ordered.  Flu vaccine recommended. Dietary bladder irritants discussed.  Follow up annually and prn.    After visit summary provided.

## 2018-01-10 ENCOUNTER — Other Ambulatory Visit (HOSPITAL_COMMUNITY)
Admission: RE | Admit: 2018-01-10 | Discharge: 2018-01-10 | Disposition: A | Discharge status: Home / Self Care | Payer: 59 | Source: Ambulatory Visit | Attending: Obstetrics and Gynecology | Admitting: Obstetrics and Gynecology

## 2018-01-10 ENCOUNTER — Encounter: Payer: Self-pay | Admitting: Obstetrics and Gynecology

## 2018-01-10 ENCOUNTER — Other Ambulatory Visit: Payer: Self-pay

## 2018-01-10 ENCOUNTER — Ambulatory Visit (INDEPENDENT_AMBULATORY_CARE_PROVIDER_SITE_OTHER): Payer: 59 | Admitting: Obstetrics and Gynecology

## 2018-01-10 VITALS — BP 100/60 | HR 70 | Resp 14 | Ht 67.0 in | Wt 129.0 lb

## 2018-01-10 DIAGNOSIS — Z01419 Encounter for gynecological examination (general) (routine) without abnormal findings: Secondary | ICD-10-CM

## 2018-01-10 MED ORDER — ZOSTER VAC RECOMB ADJUVANTED 50 MCG/0.5ML IM SUSR: 0.5000 mL | Freq: Once | INTRAMUSCULAR | 0 refills | Status: AC

## 2018-01-10 NOTE — Patient Instructions (Signed)

## 2018-01-14 LAB — CYTOLOGY - PAP
Diagnosis: NEGATIVE
HPV: NOT DETECTED

## 2018-03-26 DIAGNOSIS — Z1231 Encounter for screening mammogram for malignant neoplasm of breast: Secondary | ICD-10-CM | POA: Diagnosis not present

## 2018-05-16 DIAGNOSIS — R768 Other specified abnormal immunological findings in serum: Secondary | ICD-10-CM | POA: Diagnosis not present

## 2018-05-16 DIAGNOSIS — G43909 Migraine, unspecified, not intractable, without status migrainosus: Secondary | ICD-10-CM | POA: Diagnosis not present

## 2018-05-16 DIAGNOSIS — N390 Urinary tract infection, site not specified: Secondary | ICD-10-CM | POA: Diagnosis not present

## 2018-05-19 DIAGNOSIS — Z79899 Other long term (current) drug therapy: Secondary | ICD-10-CM | POA: Diagnosis not present

## 2018-05-19 DIAGNOSIS — R768 Other specified abnormal immunological findings in serum: Secondary | ICD-10-CM | POA: Diagnosis not present

## 2018-05-19 DIAGNOSIS — Z13220 Encounter for screening for lipoid disorders: Secondary | ICD-10-CM | POA: Diagnosis not present

## 2018-05-22 ENCOUNTER — Telehealth: Payer: Self-pay | Admitting: Neurology

## 2018-05-22 NOTE — Telephone Encounter (Signed)
Blood work done on this patient on 19 May 2018 by Dr. Paulino Rily.  White blood count is 3.3, hemoglobin of 12.9, hematocrit of 39.1, MCV of 91.5, platelets of 165.  Glucose is 102, BUN of 14, creatinine 0.76, sodium 140, potassium 4.4, chloride of 104, CO2 29, calcium 9.4, total protein 6.4, albumin 4.6, liver profile is unremarkable.  Sedimentation rate of 8.  TSH of 0.70.  Anti-nuclear antibodies, IFA was negative.  Total cholesterol level 200, LDL of 91.

## 2018-08-20 ENCOUNTER — Ambulatory Visit: Payer: Self-pay | Admitting: Rheumatology

## 2018-08-27 NOTE — Progress Notes (Deleted)
Office Visit Note  Patient: Maureen Davis             Date of Birth: 09/17/1960           MRN: [id number]             PCP: Jonathon Jordan, MD Referring: Jonathon Jordan, MD Visit Date: 09/10/2018 Occupation: @GUAROCC @  Subjective:  No chief complaint on file.   History of Present Illness: Maureen Davis is a 58 y.o. female ***   Activities of Daily Living:  Patient reports morning stiffness for *** {minute/hour:19697}.   Patient {ACTIONS;DENIES/REPORTS:21021675::"Denies"} nocturnal pain.  Difficulty dressing/grooming: {ACTIONS;DENIES/REPORTS:21021675::"Denies"} Difficulty climbing stairs: {ACTIONS;DENIES/REPORTS:21021675::"Denies"} Difficulty getting out of chair: {ACTIONS;DENIES/REPORTS:21021675::"Denies"} Difficulty using hands for taps, buttons, cutlery, and/or writing: {ACTIONS;DENIES/REPORTS:21021675::"Denies"}  No Rheumatology ROS completed.   PMFS History:  Patient Active Problem List   Diagnosis Date Noted  . DDD (degenerative disc disease), cervical 08/07/2017  . Coccygodynia 08/07/2017  . Hx of migraines 07/18/2017  . History of gastroesophageal reflux (GERD) 07/18/2017  . History of depression 07/18/2017  . Recurrent UTI 07/18/2017  . Leukopenia 07/18/2017    Past Medical History:  Diagnosis Date  . Chronic UTI   . Depression   . Frequent UTI    --sees Dr.McDiarmid  . Hypertrophy of uterus   . Leukopenia 2016   WBC 3.8  . Polyp of corpus uteri   . Uterine prolapse     Family History  Problem Relation Age of Onset  . Neuropathy Mother   . Charcot-Marie-Tooth disease Mother   . Hypertension Father   . Osteoarthritis Father   . Thyroid disease Sister        removed   . Healthy Son   . Healthy Son   . Healthy Son    Past Surgical History:  Procedure Laterality Date  . CHOLECYSTECTOMY  1998  . TUBAL LIGATION  1994   Social History   Social History Narrative  . Not on file    There is no immunization history on file for this patient.    Objective: Vital Signs: LMP 10/20/2014 (Exact Date)    Physical Exam   Musculoskeletal Exam: ***  CDAI Exam: CDAI Score: - Patient Global: -; Provider Global: - Swollen: -; Tender: - Joint Exam   No joint exam has been documented for this visit   There is currently no information documented on the homunculus. Go to the Rheumatology activity and complete the homunculus joint exam.  Investigation: No additional findings.  Imaging: No results found.  Recent Labs: Lab Results  Component Value Date   WBC 3.9 12/30/2015   HGB 12.9 12/30/2015   PLT 201 12/30/2015   NA 139 12/30/2015   K 3.7 12/30/2015   CL 102 12/30/2015   CO2 29 12/30/2015   GLUCOSE 91 12/30/2015   BUN 10 12/30/2015   CREATININE 0.73 12/30/2015   BILITOT 0.8 12/30/2015   ALKPHOS 44 12/30/2015   AST 21 12/30/2015   ALT 13 12/30/2015   PROT 7.1 07/18/2017   ALBUMIN 4.5 12/30/2015   CALCIUM 9.4 12/30/2015    Speciality Comments: No specialty comments available.  Procedures:  No procedures performed Allergies: Patient has no known allergies.   Assessment / Plan:     Visit Diagnoses: No diagnosis found.  Orders: No orders of the defined types were placed in this encounter.  No orders of the defined types were placed in this encounter.   Face-to-face time spent with patient was *** minutes. Greater than 50% of time was spent  in counseling and coordination of care.  Follow-Up Instructions: No follow-ups on file.   Earnestine Mealing, CMA  Note - This record has been created using Editor, commissioning.  Chart creation errors have been sought, but may not always  have been located. Such creation errors do not reflect on  the standard of medical care.

## 2018-09-09 NOTE — Progress Notes (Signed)
Office Visit Note  Patient: Maureen Davis             Date of Birth: Jun 09, 1960           MRN: [id number]             PCP: Jonathon Jordan, MD Referring: Jonathon Jordan, MD Visit Date: 09/10/2018 Occupation: @GUAROCC @  Subjective:  Pain in multiple joints and fatigue.   History of Present Illness: Maureen Davis is a 58 y.o. female with history of positive ANA, degenerative disc disease and polyarthralgia.  She returns today after her last visit almost a year ago.  She states she was doing regular exercise and some natural supplements which was helping her.  Around spring 2020 her symptoms came back with increased pain and discomfort.  She complains of discomfort in her neck and thoracic region.  She also has discomfort in her bilateral hips and trochanteric area.  She has been experiencing some crawling sensation in her scalp.  She also feels hoarseness in her voice.  Activities of Daily Living:  Patient reports morning stiffness for 0 minutes.   Patient Reports nocturnal pain.  Difficulty dressing/grooming: Denies Difficulty climbing stairs: Denies Difficulty getting out of chair: Denies Difficulty using hands for taps, buttons, cutlery, and/or writing: Denies  Review of Systems  Constitutional: Positive for fatigue. Negative for night sweats, weight gain and weight loss.  HENT: Positive for ear ringing and voice change. Negative for mouth sores, trouble swallowing, trouble swallowing, mouth dryness and nose dryness.   Eyes: Negative for pain, redness, itching, visual disturbance and dryness.  Respiratory: Negative for cough, shortness of breath, wheezing and difficulty breathing.   Cardiovascular: Negative for chest pain, palpitations, hypertension, irregular heartbeat and swelling in legs/feet.  Gastrointestinal: Negative for abdominal pain, blood in stool, constipation and diarrhea.  Endocrine: Positive for increased urination. Negative for excessive thirst.  Genitourinary:  Negative for painful urination and vaginal dryness.  Musculoskeletal: Positive for arthralgias and joint pain. Negative for joint swelling, myalgias, muscle weakness, morning stiffness, muscle tenderness and myalgias.  Skin: Negative for color change, rash, hair loss, redness, skin tightness, ulcers and sensitivity to sunlight.  Allergic/Immunologic: Negative for susceptible to infections.  Neurological: Positive for parasthesias. Negative for dizziness, numbness, headaches, memory loss, night sweats and weakness.  Hematological: Negative for bruising/bleeding tendency and swollen glands.  Psychiatric/Behavioral: Negative for depressed mood, confusion and sleep disturbance. The patient is not nervous/anxious.     PMFS History:  Patient Active Problem List   Diagnosis Date Noted  . DDD (degenerative disc disease), cervical 08/07/2017  . Coccygodynia 08/07/2017  . Hx of migraines 07/18/2017  . History of gastroesophageal reflux (GERD) 07/18/2017  . History of depression 07/18/2017  . Recurrent UTI 07/18/2017  . Leukopenia 07/18/2017    Past Medical History:  Diagnosis Date  . Chronic UTI   . Depression   . Frequent UTI    --sees Dr.McDiarmid  . Hypertrophy of uterus   . Leukopenia 2016   WBC 3.8  . Polyp of corpus uteri   . Uterine prolapse     Family History  Problem Relation Age of Onset  . Neuropathy Mother   . Charcot-Marie-Tooth disease Mother   . Hypertension Father   . Osteoarthritis Father   . Thyroid disease Sister        removed   . Healthy Son   . Healthy Son   . Healthy Son    Past Surgical History:  Procedure Laterality Date  . CHOLECYSTECTOMY  1998  .  TUBAL LIGATION  1994   Social History   Social History Narrative  . Not on file    There is no immunization history on file for this patient.   Objective: Vital Signs: BP 117/75 (BP Location: Left Arm, Patient Position: Sitting, Cuff Size: Normal)   Pulse 70   Resp 12   Ht 5\' 7"  (1.702 m)   Wt  127 lb 6.4 oz (57.8 kg)   LMP 10/20/2014 (Exact Date)   BMI 19.95 kg/m    Physical Exam Vitals signs and nursing note reviewed.  Constitutional:      Appearance: She is well-developed.  HENT:     Head: Normocephalic and atraumatic.  Eyes:     Conjunctiva/sclera: Conjunctivae normal.  Neck:     Musculoskeletal: Normal range of motion.  Cardiovascular:     Rate and Rhythm: Normal rate and regular rhythm.     Heart sounds: Normal heart sounds.  Pulmonary:     Effort: Pulmonary effort is normal.     Breath sounds: Normal breath sounds.  Abdominal:     General: Bowel sounds are normal.     Palpations: Abdomen is soft.  Lymphadenopathy:     Cervical: No cervical adenopathy.  Skin:    General: Skin is warm and dry.     Capillary Refill: Capillary refill takes less than 2 seconds.  Neurological:     Mental Status: She is alert and oriented to person, place, and time.  Psychiatric:        Behavior: Behavior normal.      Musculoskeletal Exam: She has some stiffness with range of motion of her cervical spine.  Shoulder joints, elbow joints, wrist joints with good range of motion.  She has some DIP and PIP thickening in her hands.  Hip joints with good range of motion.  She had tenderness over bilateral trochanteric bursa.  Hip joints, knee joints, ankles showed good range of motion.  She has some DIP and PIP thickening in her feet.  CDAI Exam: CDAI Score: - Patient Global: -; Provider Global: - Swollen: -; Tender: - Joint Exam   No joint exam has been documented for this visit   There is currently no information documented on the homunculus. Go to the Rheumatology activity and complete the homunculus joint exam.  Investigation: No additional findings.  Imaging: No results found.  Recent Labs: Lab Results  Component Value Date   WBC 3.9 12/30/2015   HGB 12.9 12/30/2015   PLT 201 12/30/2015   NA 139 12/30/2015   K 3.7 12/30/2015   CL 102 12/30/2015   CO2 29  12/30/2015   GLUCOSE 91 12/30/2015   BUN 10 12/30/2015   CREATININE 0.73 12/30/2015   BILITOT 0.8 12/30/2015   ALKPHOS 44 12/30/2015   AST 21 12/30/2015   ALT 13 12/30/2015   PROT 7.1 07/18/2017   ALBUMIN 4.5 12/30/2015   CALCIUM 9.4 12/30/2015    Speciality Comments: No specialty comments available.  Procedures:  No procedures performed Allergies: Patient has no known allergies.   Assessment / Plan:     Visit Diagnoses: Positive ANA (antinuclear antibody) - ANA 1:80 NH, ENA negative, complements normal.  She has no clinical features of autoimmune disease.  -Patient is still does not have any clinical features of autoimmune disease.  Although she is very much concerned due to ongoing fatigue and not feeling good.  I will obtain labs again.  Plan: CBC with Differential/Platelet, COMPLETE METABOLIC PANEL WITH GFR, Anti-DNA antibody, double-stranded, C3 and  C4, Sedimentation rate, ANA,   Primary osteoarthritis of both hands -joint protection muscle strengthening was discussed.  She has been using some natural anti-inflammatories.  DDD (degenerative disc disease), cervical -she is having stiffness with range of motion.  Pain in thoracic spine -she complains of midthoracic pain.  She has some point tenderness around T7-T8 region.  I have given her handout on exercises.  I also offered x-rays of thoracic spine which she declined.  Trochanteric bursitis, right hip -she has bilateral trochanteric bursitis.  Handout on IT band exercises was given.  Coccygodynia -she continues to have coccygodynia.  She has tried physical therapy in the past, to which she had no response.  Chronic SI joint pain -persist.  She has been doing yoga which has been helpful.  Tinnitus of both ears -this is a new issue for her.  I will refer her to ENT.  Laryngitis -she is also dealing with laryngitis for some time.  I will refer her to ENT.  Paresthesia of skin -she complains of paresthesia on her scalp.  I  have advised her if her symptoms persist she may follow-up with Dr. Vear ClockPhillips.  Postmenopausal -she needs to have a bone density.  She states she will schedule it through her GYN.  History of gastroesophageal reflux (GERD) -she states the symptoms get worse with NSAIDs.  History of depression -she has been on medications.  Other fatigue   Orders: Orders Placed This Encounter  Procedures  . CBC with Differential/Platelet  . COMPLETE METABOLIC PANEL WITH GFR  . Anti-DNA antibody, double-stranded  . C3 and C4  . Sedimentation rate  . ANA   No orders of the defined types were placed in this encounter.   Face-to-face time spent with patient was 35 minutes. Greater than 50% of time was spent in counseling and coordination of care.  Follow-Up Instructions: Return in about 1 year (around 09/10/2019) for Osteoarthritis.   Pollyann SavoyShaili Nafeesa Dils, MD  Note - This record has been created using Animal nutritionistDragon software.  Chart creation errors have been sought, but may not always  have been located. Such creation errors do not reflect on  the standard of medical care.

## 2018-09-10 ENCOUNTER — Ambulatory Visit: Payer: 59 | Admitting: Rheumatology

## 2018-09-10 ENCOUNTER — Ambulatory Visit (INDEPENDENT_AMBULATORY_CARE_PROVIDER_SITE_OTHER): Payer: 59 | Admitting: Rheumatology

## 2018-09-10 ENCOUNTER — Other Ambulatory Visit: Payer: Self-pay

## 2018-09-10 ENCOUNTER — Encounter: Payer: Self-pay | Admitting: Rheumatology

## 2018-09-10 VITALS — BP 117/75 | HR 70 | Resp 12 | Ht 67.0 in | Wt 127.4 lb

## 2018-09-10 DIAGNOSIS — R5383 Other fatigue: Secondary | ICD-10-CM

## 2018-09-10 DIAGNOSIS — M533 Sacrococcygeal disorders, not elsewhere classified: Secondary | ICD-10-CM

## 2018-09-10 DIAGNOSIS — G8929 Other chronic pain: Secondary | ICD-10-CM

## 2018-09-10 DIAGNOSIS — M5030 Other cervical disc degeneration, unspecified cervical region: Secondary | ICD-10-CM | POA: Diagnosis not present

## 2018-09-10 DIAGNOSIS — M546 Pain in thoracic spine: Secondary | ICD-10-CM | POA: Diagnosis not present

## 2018-09-10 DIAGNOSIS — J040 Acute laryngitis: Secondary | ICD-10-CM

## 2018-09-10 DIAGNOSIS — R202 Paresthesia of skin: Secondary | ICD-10-CM

## 2018-09-10 DIAGNOSIS — R768 Other specified abnormal immunological findings in serum: Secondary | ICD-10-CM | POA: Diagnosis not present

## 2018-09-10 DIAGNOSIS — Z780 Asymptomatic menopausal state: Secondary | ICD-10-CM

## 2018-09-10 DIAGNOSIS — M19041 Primary osteoarthritis, right hand: Secondary | ICD-10-CM | POA: Diagnosis not present

## 2018-09-10 DIAGNOSIS — H9313 Tinnitus, bilateral: Secondary | ICD-10-CM

## 2018-09-10 DIAGNOSIS — M7061 Trochanteric bursitis, right hip: Secondary | ICD-10-CM

## 2018-09-10 DIAGNOSIS — Z8719 Personal history of other diseases of the digestive system: Secondary | ICD-10-CM

## 2018-09-10 DIAGNOSIS — Z8659 Personal history of other mental and behavioral disorders: Secondary | ICD-10-CM

## 2018-09-10 DIAGNOSIS — M19042 Primary osteoarthritis, left hand: Secondary | ICD-10-CM

## 2018-09-10 NOTE — Patient Instructions (Signed)
Back Exercises The following exercises strengthen the muscles that help to support the trunk and back. They also help to keep the lower back flexible. Doing these exercises can help to prevent back pain or lessen existing pain.  If you have back pain or discomfort, try doing these exercises 2-3 times each day or as told by your health care provider.  As your pain improves, do them once each day, but increase the number of times that you repeat the steps for each exercise (do more repetitions).  To prevent the recurrence of back pain, continue to do these exercises once each day or as told by your health care provider. Do exercises exactly as told by your health care provider and adjust them as directed. It is normal to feel mild stretching, pulling, tightness, or discomfort as you do these exercises, but you should stop right away if you feel sudden pain or your pain gets worse. Exercises Single knee to chest Repeat these steps 3-5 times for each leg: 1. Lie on your back on a firm bed or the floor with your legs extended. 2. Bring one knee to your chest. Your other leg should stay extended and in contact with the floor. 3. Hold your knee in place by grabbing your knee or thigh with both hands and hold. 4. Pull on your knee until you feel a gentle stretch in your lower back or buttocks. 5. Hold the stretch for 10-30 seconds. 6. Slowly release and straighten your leg. Pelvic tilt Repeat these steps 5-10 times: 1. Lie on your back on a firm bed or the floor with your legs extended. 2. Bend your knees so they are pointing toward the ceiling and your feet are flat on the floor. 3. Tighten your lower abdominal muscles to press your lower back against the floor. This motion will tilt your pelvis so your tailbone points up toward the ceiling instead of pointing to your feet or the floor. 4. With gentle tension and even breathing, hold this position for 5-10 seconds. Cat-cow Repeat these steps until  your lower back becomes more flexible: 1. Get into a hands-and-knees position on a firm surface. Keep your hands under your shoulders, and keep your knees under your hips. You may place padding under your knees for comfort. 2. Let your head hang down toward your chest. Contract your abdominal muscles and point your tailbone toward the floor so your lower back becomes rounded like the back of a cat. 3. Hold this position for 5 seconds. 4. Slowly lift your head, let your abdominal muscles relax and point your tailbone up toward the ceiling so your back forms a sagging arch like the back of a cow. 5. Hold this position for 5 seconds.  Press-ups Repeat these steps 5-10 times: 1. Lie on your abdomen (face-down) on the floor. 2. Place your palms near your head, about shoulder-width apart. 3. Keeping your back as relaxed as possible and keeping your hips on the floor, slowly straighten your arms to raise the top half of your body and lift your shoulders. Do not use your back muscles to raise your upper torso. You may adjust the placement of your hands to make yourself more comfortable. 4. Hold this position for 5 seconds while you keep your back relaxed. 5. Slowly return to lying flat on the floor.  Bridges Repeat these steps 10 times: 1. Lie on your back on a firm surface. 2. Bend your knees so they are pointing toward the ceiling and   your feet are flat on the floor. Your arms should be flat at your sides, next to your body. 3. Tighten your buttocks muscles and lift your buttocks off the floor until your waist is at almost the same height as your knees. You should feel the muscles working in your buttocks and the back of your thighs. If you do not feel these muscles, slide your feet 1-2 inches farther away from your buttocks. 4. Hold this position for 3-5 seconds. 5. Slowly lower your hips to the starting position, and allow your buttocks muscles to relax completely. If this exercise is too easy, try  doing it with your arms crossed over your chest. Abdominal crunches Repeat these steps 5-10 times: 1. Lie on your back on a firm bed or the floor with your legs extended. 2. Bend your knees so they are pointing toward the ceiling and your feet are flat on the floor. 3. Cross your arms over your chest. 4. Tip your chin slightly toward your chest without bending your neck. 5. Tighten your abdominal muscles and slowly raise your trunk (torso) high enough to lift your shoulder blades a tiny bit off the floor. Avoid raising your torso higher than that because it can put too much stress on your low back and does not help to strengthen your abdominal muscles. 6. Slowly return to your starting position. Back lifts Repeat these steps 5-10 times: 1. Lie on your abdomen (face-down) with your arms at your sides, and rest your forehead on the floor. 2. Tighten the muscles in your legs and your buttocks. 3. Slowly lift your chest off the floor while you keep your hips pressed to the floor. Keep the back of your head in line with the curve in your back. Your eyes should be looking at the floor. 4. Hold this position for 3-5 seconds. 5. Slowly return to your starting position. Contact a health care provider if:  Your back pain or discomfort gets much worse when you do an exercise.  Your worsening back pain or discomfort does not lessen within 2 hours after you exercise. If you have any of these problems, stop doing these exercises right away. Do not do them again unless your health care provider says that you can. Get help right away if:  You develop sudden, severe back pain. If this happens, stop doing the exercises right away. Do not do them again unless your health care provider says that you can. This information is not intended to replace advice given to you by your health care provider. Make sure you discuss any questions you have with your health care provider. Document Released: 02/23/2004 Document  Revised: 05/22/2018 Document Reviewed: 10/17/2017 Elsevier Patient Education  2020 [employer] Iliotibial Band Syndrome Rehab Ask your health care provider which exercises are safe for you. Do exercises exactly as told by your health care provider and adjust them as directed. It is normal to feel mild stretching, pulling, tightness, or discomfort as you do these exercises. Stop right away if you feel sudden pain or your pain gets significantly worse. Do not begin these exercises until told by your health care provider. Stretching and range-of-motion exercises These exercises warm up your muscles and joints and improve the movement and flexibility of your hip and pelvis. Quadriceps stretch, prone  1. Lie on your abdomen on a firm surface, such as a bed or padded floor (prone position). 2. Bend your left / right knee and reach back to hold your ankle or  pant leg. If you cannot reach your ankle or pant leg, loop a belt around your foot and grab the belt instead. 3. Gently pull your heel toward your buttocks. Your knee should not slide out to the side. You should feel a stretch in the front of your thigh and knee (quadriceps). 4. Hold this position for __________ seconds. Repeat __________ times. Complete this exercise __________ times a day. Iliotibial band stretch An iliotibial band is a strong band of muscle tissue that runs from the outer side of your hip to the outer side of your thigh and knee. 1. Lie on your side with your left / right leg in the top position. 2. Bend both of your knees and grab your left / right ankle. Stretch out your bottom arm to help you balance. 3. Slowly bring your top knee back so your thigh goes behind your trunk. 4. Slowly lower your top leg toward the floor until you feel a gentle stretch on the outside of your left / right hip and thigh. If you do not feel a stretch and your knee will not fall farther, place the heel of your other foot on top of your knee and pull  your knee down toward the floor with your foot. 5. Hold this position for __________ seconds. Repeat __________ times. Complete this exercise __________ times a day. Strengthening exercises These exercises build strength and endurance in your hip and pelvis. Endurance is the ability to use your muscles for a long time, even after they get tired. Straight leg raises, side-lying This exercise strengthens the muscles that rotate the leg at the hip and move it away from your body (hip abductors). 1. Lie on your side with your left / right leg in the top position. Lie so your head, shoulder, hip, and knee line up. You may bend your bottom knee to help you balance. 2. Roll your hips slightly forward so your hips are stacked directly over each other and your left / right knee is facing forward. 3. Tense the muscles in your outer thigh and lift your top leg 4-6 inches (10-15 cm). 4. Hold this position for __________ seconds. 5. Slowly return to the starting position. Let your muscles relax completely before doing another repetition. Repeat __________ times. Complete this exercise __________ times a day. Leg raises, prone This exercise strengthens the muscles that move the hips (hip extensors). 1. Lie on your abdomen on your bed or a firm surface. You can put a pillow under your hips if that is more comfortable for your lower back. 2. Bend your left / right knee so your foot is straight up in the air. 3. Squeeze your buttocks muscles and lift your left / right thigh off the bed. Do not let your back arch. 4. Tense your thigh muscle as hard as you can without increasing any knee pain. 5. Hold this position for __________ seconds. 6. Slowly lower your leg to the starting position and allow it to relax completely. Repeat __________ times. Complete this exercise __________ times a day. Hip hike 1. Stand sideways on a bottom step. Stand on your left / right leg with your other foot unsupported next to the  step. You can hold on to the railing or wall for balance if needed. 2. Keep your knees straight and your torso square. Then lift your left / right hip up toward the ceiling. 3. Slowly let your left / right hip lower toward the floor, past the starting position. Your  foot should get closer to the floor. Do not lean or bend your knees. Repeat __________ times. Complete this exercise __________ times a day. This information is not intended to replace advice given to you by your health care provider. Make sure you discuss any questions you have with your health care provider. Document Released: 01/15/2005 Document Revised: 05/08/2018 Document Reviewed: 11/06/2017 Elsevier Patient Education  2020 [employer].

## 2018-09-10 NOTE — Addendum Note (Signed)
Addended by: Earnestine Mealing on: 09/10/2018 04:15 PM   Modules accepted: Orders

## 2018-09-12 LAB — COMPLETE METABOLIC PANEL WITH GFR
AG Ratio: 1.8 (calc) (ref 1.0–2.5)
ALT: 17 U/L (ref 6–29)
AST: 26 U/L (ref 10–35)
Albumin: 4.6 g/dL (ref 3.6–5.1)
Alkaline phosphatase (APISO): 60 U/L (ref 37–153)
BUN: 16 mg/dL (ref 7–25)
CO2: 29 mmol/L (ref 20–32)
Calcium: 9.9 mg/dL (ref 8.6–10.4)
Chloride: 104 mmol/L (ref 98–110)
Creat: 0.7 mg/dL (ref 0.50–1.05)
GFR, Est African American: 112 mL/min/{1.73_m2} (ref >=60)
GFR, Est Non African American: 97 mL/min/{1.73_m2} (ref >=60)
Globulin: 2.5 g/dL (calc) (ref 1.9–3.7)
Glucose, Bld: 94 mg/dL (ref 65–99)
Potassium: 4.4 mmol/L (ref 3.5–5.3)
Sodium: 142 mmol/L (ref 135–146)
Total Bilirubin: 0.6 mg/dL (ref 0.2–1.2)
Total Protein: 7.1 g/dL (ref 6.1–8.1)

## 2018-09-12 LAB — CBC WITH DIFFERENTIAL/PLATELET
Absolute Monocytes: 400 cells/uL (ref 200–950)
Basophils Absolute: 20 cells/uL (ref 0–200)
Basophils Relative: 0.5 %
Eosinophils Absolute: 120 cells/uL (ref 15–500)
Eosinophils Relative: 3 %
HCT: 39.3 % (ref 35.0–45.0)
Hemoglobin: 13.1 g/dL (ref 11.7–15.5)
Lymphs Abs: 1200 cells/uL (ref 850–3900)
MCH: 30.7 pg (ref 27.0–33.0)
MCHC: 33.3 g/dL (ref 32.0–36.0)
MCV: 92 fL (ref 80.0–100.0)
MPV: 11.6 fL (ref 7.5–12.5)
Monocytes Relative: 10 %
Neutro Abs: 2260 cells/uL (ref 1500–7800)
Neutrophils Relative %: 56.5 %
Platelets: 172 10*3/uL (ref 140–400)
RBC: 4.27 10*6/uL (ref 3.80–5.10)
RDW: 12.7 % (ref 11.0–15.0)
Total Lymphocyte: 30 %
WBC: 4 10*3/uL (ref 3.8–10.8)

## 2018-09-12 LAB — ANTI-DNA ANTIBODY, DOUBLE-STRANDED: ds DNA Ab: 2 IU/mL

## 2018-09-12 LAB — C3 AND C4
C3 Complement: 95 mg/dL (ref 83–193)
C4 Complement: 19 mg/dL (ref 15–57)

## 2018-09-12 LAB — ANA: Anti Nuclear Antibody (ANA): POSITIVE — AB

## 2018-09-12 LAB — ANTI-NUCLEAR AB-TITER (ANA TITER): ANA Titer 1: 1:80 {titer} — ABNORMAL HIGH

## 2018-09-12 LAB — SEDIMENTATION RATE: Sed Rate: 6 mm/h (ref 0–30)

## 2018-09-12 NOTE — Progress Notes (Signed)
ANA is low titer which is nonspecific.  All other labs are negative.

## 2019-01-28 ENCOUNTER — Telehealth: Payer: Self-pay | Admitting: Obstetrics and Gynecology

## 2019-01-28 NOTE — Telephone Encounter (Signed)
Patient will call back to reschedule cancelled appointment because of provider schedule change.

## 2019-01-30 HISTORY — PX: RETINAL TEAR REPAIR CRYOTHERAPY: SHX5304

## 2019-02-06 ENCOUNTER — Ambulatory Visit: Payer: 59 | Admitting: Obstetrics and Gynecology

## 2019-03-23 ENCOUNTER — Other Ambulatory Visit: Payer: Self-pay

## 2019-03-23 NOTE — Progress Notes (Signed)
59 y.o. G39P3003 Married Caucasian female here for annual exam.    Saw Rheumatology last year for positiive ANA and osteoarthritis.  Taking anti-inflammatories.  Uses a TENS unit.  Does massage.  Having painful intercourse. Lubricants not so helpful. Coconut oil helped for a while.  Used vaginal estrogen in the past.   Taking vitamins.   Asking for urine check due to bladder discomfort.  Thought she was getting a UTI.   Urine dip - negative.   PCP:   Jonathon Jordan, MD   Patient's last menstrual period was 10/20/2014 (exact date).           Sexually active: Yes.    The current method of family planning is tubal ligation.    Exercising: No.  The patient does not participate in regular exercise at present. Smoker:  no  Health Maintenance: Pap:  01-10-18 negative, HR HPV negative            12-21-14 negative, HR HPV negative  History of abnormal Pap:  Yes.  1994 hx of cryotherapy to cervix. MMG:  03-26-2018 density C/BIRADS 1 negative  Colonoscopy:  2016 normal, Dr. Collene Mares, next 2026  BMD:  no  Result  n/a TDaP:  PCP Gardasil:   no HIV: no Hep C: no Screening Labs:   PCP.    reports that she has never smoked. She has never used smokeless tobacco. She reports current alcohol use of about 2.0 - 3.0 standard drinks of alcohol per week. She reports that she does not use drugs.  Past Medical History:  Diagnosis Date  . Chronic UTI   . Depression   . Frequent UTI    --sees Dr.McDiarmid  . Hypertrophy of uterus   . Leukopenia 2016   WBC 3.8  . Polyp of corpus uteri   . Uterine prolapse     Past Surgical History:  Procedure Laterality Date  . CHOLECYSTECTOMY  1998  . TUBAL LIGATION  1994    Current Outpatient Medications  Medication Sig Dispense Refill  . carisoprodol (SOMA) 350 MG tablet TAKE 1 TABLET AS NEEDED FOUR TIMES A DAY ORALLY 30 DAYS    . citalopram (CELEXA) 20 MG tablet Take 20 mg by mouth at bedtime.  0  . SUMAtriptan (IMITREX) 100 MG tablet PLEASE SEE  ATTACHED FOR DETAILED DIRECTIONS     No current facility-administered medications for this visit.    Family History  Problem Relation Age of Onset  . Neuropathy Mother   . Charcot-Marie-Tooth disease Mother   . Hypertension Father   . Osteoarthritis Father   . Thyroid disease Sister        removed   . Healthy Son   . Healthy Son   . Healthy Son     Review of Systems  Constitutional: Negative.   HENT: Negative.   Eyes: Negative.   Respiratory: Negative.   Cardiovascular: Negative.   Gastrointestinal: Negative.   Endocrine: Negative.   Genitourinary: Positive for dyspareunia and dysuria.  Musculoskeletal: Negative.   Skin: Negative.   Allergic/Immunologic: Negative.   Neurological: Negative.   Hematological: Negative.   Psychiatric/Behavioral: Negative.     Exam:   BP (!) 108/56 (BP Location: Right Arm, Patient Position: Sitting, Cuff Size: Normal)   Pulse 66   Temp (!) 97 F (36.1 C) (Skin)   Resp 14   Ht 5\' 7"  (1.702 m)   Wt 130 lb 8 oz (59.2 kg)   LMP 10/20/2014 (Exact Date)   BMI 20.44 kg/m  General appearance: alert, cooperative and appears stated age Head: normocephalic, without obvious abnormality, atraumatic Neck: no adenopathy, supple, symmetrical, trachea midline and thyroid normal to inspection and palpation Lungs: clear to auscultation bilaterally Breasts: normal appearance, no masses or tenderness, No nipple retraction or dimpling, No nipple discharge or bleeding, No axillary adenopathy Heart: regular rate and rhythm Abdomen: soft, non-tender; no masses, no organomegaly Extremities: extremities normal, atraumatic, no cyanosis or edema Skin: skin color, texture, turgor normal. No rashes or lesions Lymph nodes: cervical, supraclavicular, and axillary nodes normal. Neurologic: grossly normal  Pelvic: External genitalia:  no lesions              No abnormal inguinal nodes palpated.              Urethra:  normal appearing urethra with no masses,  tenderness or lesions              Bartholins and Skenes: normal                 Vagina: absence of rugae, pale mucosa, no lesions              Cervix: no lesions              Pap taken: No. Bimanual Exam:  Uterus:  normal size, contour, position, consistency, mobility, non-tender              Adnexa: no mass, fullness, tenderness              Rectal exam: Yes.  .  Confirms.              Anus:  normal sphincter tone, no lesions  Chaperone was present for exam.  Assessment:   Well woman visit with normal exam. Hx UTIs.  Osteoarthritis.  Vaginal atrophy.  Plan: Mammogram screening discussed. Self breast awareness reviewed. Pap and HR HPV as above. Guidelines for Calcium, Vitamin D, regular exercise program including cardiovascular and weight bearing exercise. Vaginal estrace cream.  Instructed in use.  Benefits reviewed for treating urogenital atrophy.  I discussed potential effect on breast cancer.   Follow up annually and prn.

## 2019-03-24 ENCOUNTER — Encounter: Payer: Self-pay | Admitting: Obstetrics and Gynecology

## 2019-03-24 ENCOUNTER — Ambulatory Visit (INDEPENDENT_AMBULATORY_CARE_PROVIDER_SITE_OTHER): Payer: 59 | Admitting: Obstetrics and Gynecology

## 2019-03-24 VITALS — BP 108/56 | HR 66 | Temp 97.0°F | Resp 14 | Ht 67.0 in | Wt 130.5 lb

## 2019-03-24 DIAGNOSIS — R300 Dysuria: Secondary | ICD-10-CM | POA: Diagnosis not present

## 2019-03-24 DIAGNOSIS — Z01419 Encounter for gynecological examination (general) (routine) without abnormal findings: Secondary | ICD-10-CM | POA: Diagnosis not present

## 2019-03-24 LAB — POCT URINALYSIS DIPSTICK
Bilirubin, UA: NEGATIVE
Blood, UA: NEGATIVE
Glucose, UA: NEGATIVE
Ketones, UA: NEGATIVE
Leukocytes, UA: NEGATIVE
Nitrite, UA: NEGATIVE
Protein, UA: NEGATIVE
Urobilinogen, UA: 0.2 E.U./dL
pH, UA: 7 (ref 5.0–8.0)

## 2019-03-24 MED ORDER — ESTRADIOL 0.1 MG/GM VA CREA: TOPICAL_CREAM | VAGINAL | 2 refills | Status: DC

## 2019-03-24 NOTE — Patient Instructions (Signed)
Consider [employer].   EXERCISE AND DIET:  We recommended that you start or continue a regular exercise program for good health. Regular exercise means any activity that makes your heart beat faster and makes you sweat.  We recommend exercising at least 30 minutes per day at least 3 days a week, preferably 4 or 5.  We also recommend a diet low in fat and sugar.  Inactivity, poor dietary choices and obesity can cause diabetes, heart attack, stroke, and kidney damage, among others.    ALCOHOL AND SMOKING:  Women should limit their alcohol intake to no more than 7 drinks/beers/glasses of wine (combined, not each!) per week. Moderation of alcohol intake to this level decreases your risk of breast cancer and liver damage. And of course, no recreational drugs are part of a healthy lifestyle.  And absolutely no smoking or even second hand smoke. Most people know smoking can cause heart and lung diseases, but did you know it also contributes to weakening of your bones? Aging of your skin?  Yellowing of your teeth and nails?  CALCIUM AND VITAMIN D:  Adequate intake of calcium and Vitamin D are recommended.  The recommendations for exact amounts of these supplements seem to change often, but generally speaking 600 mg of calcium (either carbonate or citrate) and 800 units of Vitamin D per day seems prudent. Certain women may benefit from higher intake of Vitamin D.  If you are among these women, your doctor will have told you during your visit.    PAP SMEARS:  Pap smears, to check for cervical cancer or precancers,  have traditionally been done yearly, although recent scientific advances have shown that most women can have pap smears less often.  However, every woman still should have a physical exam from her gynecologist every year. It will include a breast check, inspection of the vulva and vagina to check for abnormal growths or skin changes, a visual exam of the cervix, and then an exam to evaluate  the size and shape of the uterus and ovaries.  And after 58 years of age, a rectal exam is indicated to check for rectal cancers. We will also provide age appropriate advice regarding health maintenance, like when you should have certain vaccines, screening for sexually transmitted diseases, bone density testing, colonoscopy, mammograms, etc.   MAMMOGRAMS:  All women over 58 years old should have a yearly mammogram. Many facilities now offer a "3D" mammogram, which may cost around $50 extra out of pocket. If possible,  we recommend you accept the option to have the 3D mammogram performed.  It both reduces the number of women who will be called back for extra views which then turn out to be normal, and it is better than the routine mammogram at detecting truly abnormal areas.    COLONOSCOPY:  Colonoscopy to screen for colon cancer is recommended for all women at age 78.  We know, you hate the idea of the prep.  We agree, BUT, having colon cancer and not knowing it is worse!!  Colon cancer so often starts as a polyp that can be seen and removed at colonscopy, which can quite literally save your life!  And if your first colonoscopy is normal and you have no family history of colon cancer, most women don't have to have it again for 10 years.  Once every ten years, you can do something that may end up saving your life, right?  We will be happy to help you get  it scheduled when you are ready.  Be sure to check your insurance coverage so you understand how much it will cost.  It may be covered as a preventative service at no cost, but you should check your particular policy.

## 2019-04-22 ENCOUNTER — Other Ambulatory Visit: Payer: Self-pay | Admitting: Family Medicine

## 2019-04-22 DIAGNOSIS — M5430 Sciatica, unspecified side: Secondary | ICD-10-CM

## 2019-04-28 ENCOUNTER — Emergency Department (HOSPITAL_COMMUNITY)
Admission: EM | Admit: 2019-04-28 | Discharge: 2019-04-28 | Disposition: A | Discharge status: Home / Self Care | Payer: 59 | Source: Home / Self Care | Attending: Emergency Medicine | Admitting: Emergency Medicine

## 2019-04-28 ENCOUNTER — Encounter (HOSPITAL_COMMUNITY): Payer: Self-pay | Admitting: Emergency Medicine

## 2019-04-28 ENCOUNTER — Other Ambulatory Visit: Payer: Self-pay

## 2019-04-28 DIAGNOSIS — Z87440 Personal history of urinary (tract) infections: Secondary | ICD-10-CM | POA: Insufficient documentation

## 2019-04-28 DIAGNOSIS — M5432 Sciatica, left side: Secondary | ICD-10-CM

## 2019-04-28 DIAGNOSIS — Z79899 Other long term (current) drug therapy: Secondary | ICD-10-CM | POA: Insufficient documentation

## 2019-04-28 MED ORDER — OXYCODONE-ACETAMINOPHEN 5-325 MG PO TABS: 1.0000 | ORAL_TABLET | Freq: Four times a day (QID) | ORAL | 0 refills | Status: AC | PRN

## 2019-04-28 MED ORDER — KETOROLAC TROMETHAMINE 15 MG/ML IJ SOLN
15.0000 mg | Freq: Once | INTRAMUSCULAR | Status: AC
  Administered 2019-04-28: 10:00:00 15 mg via INTRAVENOUS
  Filled 2019-04-28: qty 1

## 2019-04-28 MED ORDER — OXYCODONE-ACETAMINOPHEN 5-325 MG PO TABS
1.0000 | ORAL_TABLET | Freq: Once | ORAL | Status: AC
  Administered 2019-04-28: 1 via ORAL
  Filled 2019-04-28: qty 1

## 2019-04-28 MED ORDER — HYDROMORPHONE HCL 1 MG/ML IJ SOLN
1.0000 mg | Freq: Once | INTRAMUSCULAR | Status: AC
  Administered 2019-04-28: 09:00:00 1 mg via INTRAVENOUS
  Filled 2019-04-28: qty 1

## 2019-04-28 NOTE — ED Provider Notes (Signed)
[facility] EMERGENCY DEPARTMENT Provider Note   CSN: [id number] Arrival date & time: 04/28/19  2706     History Chief Complaint  Patient presents with  . Hip Pain  . Leg Pain   Maureen Davis is a 59 y.o. female.  Maureen Davis is a 59 year old female with a history of DDD, depression, and recurrent UTIs who presents with acute left lower extremity pain.  The patient states she has had a waterskiing accident a year ago which started her chronic back pain. Over the past couple weeks, the patient's been experiencing increased left-sided sciatic nerve pain.  The pain is 10/10 in severity and shoots down from her left lower back to her feet.  She has been seen by her PCP, who prescribed a prednisone taper, which the patient finished yesterday.  She is also currently taking Percocet 5-325 mg every 4 hours and muscle relaxers with no relief.  She had an MRI of her lumbar spine 2 days ago which demonstrated left paracentral disc extrusion that migrates caudally at the L4-L5 producing severe left lateral recess stenosis and impingement of the transversing L4 nerve root.  Patient currently denies any fevers, chest pain, cough, abdominal pain, or changes in her bowel or bladder habits.  She denies any numbness or tingling in her groin region and denies bowel or bladder incontinence.       Past Medical History:  Diagnosis Date  . Chronic UTI   . Depression   . Frequent UTI    --sees Dr.McDiarmid  . Hypertrophy of uterus   . Leukopenia 2016   WBC 3.8  . Polyp of corpus uteri   . Uterine prolapse    Patient Active Problem List   Diagnosis Date Noted  . DDD (degenerative disc disease), cervical 08/07/2017  . Coccygodynia 08/07/2017  . Hx of migraines 07/18/2017  . History of gastroesophageal reflux (GERD) 07/18/2017  . History of depression 07/18/2017  . Recurrent UTI 07/18/2017  . Leukopenia 07/18/2017   Past Surgical History:  Procedure Laterality Date  .  CHOLECYSTECTOMY  1998  . TUBAL LIGATION  1994    OB History    Gravida  3   Para  3   Term  3   Preterm      AB      Living  3     SAB      TAB      Ectopic      Multiple      Live Births             Family History  Problem Relation Age of Onset  . Neuropathy Mother   . Charcot-Marie-Tooth disease Mother   . Hypertension Father   . Osteoarthritis Father   . Thyroid disease Sister        removed   . Healthy Son   . Healthy Son   . Healthy Son    Social History   Tobacco Use  . Smoking status: Never Smoker  . Smokeless tobacco: Never Used  Substance Use Topics  . Alcohol use: Yes    Alcohol/week: 2.0 - 3.0 standard drinks    Types: 2 - 3 Standard drinks or equivalent per week    Comment: occ  . Drug use: Never   Home Medications Prior to Admission medications   Medication Sig Start Date End Date Taking? Authorizing Provider  carisoprodol (SOMA) 350 MG tablet TAKE 1 TABLET AS NEEDED FOUR TIMES A DAY ORALLY 30 DAYS 05/22/18  [provider]  citalopram (CELEXA) 20 MG tablet Take 20 mg by mouth at bedtime. 12/22/13   [provider]  estradiol (ESTRACE) 0.1 MG/GM vaginal cream Use 1/2 g vaginally every night for the first 2 weeks, then use 1/2 g vaginally two or three times per week as needed to maintain symptom relief. 03/24/19   Patton Salles, MD  SUMAtriptan (IMITREX) 100 MG tablet PLEASE SEE ATTACHED FOR DETAILED DIRECTIONS 02/11/19   [provider]   Allergies    Patient has no known allergies.  Review of Systems   Review of Systems  All other systems reviewed and are negative.  Physical Exam Updated Vital Signs BP (!) 144/78 (BP Location: Left Arm)   Pulse 90   Temp 98.3 F (36.8 C) (Oral)   Resp 12   Ht 5\' 7"  (1.702 m)   Wt 60 kg   LMP 10/20/2014 (Exact Date)   SpO2 100%   BMI 20.72 kg/m   Physical Exam Vitals reviewed.  Constitutional:      General: She is in acute distress.      Appearance: She is not ill-appearing, toxic-appearing or diaphoretic.  HENT:     Head: Normocephalic and atraumatic.  Eyes:     Extraocular Movements: Extraocular movements intact.  Cardiovascular:     Comments: Warm and well perfused Pulmonary:     Effort: Pulmonary effort is normal.  Musculoskeletal:     Right lower leg: No edema.     Left lower leg: No edema.  Skin:    General: Skin is warm.  Neurological:     General: No focal deficit present.     Mental Status: She is alert and oriented to person, place, and time.     Cranial Nerves: No cranial nerve deficit.     Sensory: Sensory deficit (decreased sensation in the L knee and L big toe, otherwise intact in bilateral upper and lower extremities) present.     Motor: No weakness (secondary to pain).     Coordination: Coordination normal.     Deep Tendon Reflexes: Reflexes normal.  Psychiatric:        Mood and Affect: Mood normal.    ED Results / Procedures / Treatments   Labs (all labs ordered are listed, but only abnormal results are displayed) Labs Reviewed - No data to display  EKG None  Radiology MRI Lumbar (care everywhere 3/26)  IMPRESSION: 1. Degenerative disc disease and facet arthrosis as described above. This is most notable for a LEFT paracentral disc extrusion that migrates caudally at L4-L5 producing severe LEFT lateral recess stenosis and impinging of the traversing LEFT L4 nerve root. 2. Benign-appearing subacute/chronic superior endplate compression fracture of the L4 vertebral body with mild loss of vertebral body height that has not completely healed. No significant retropulsion.  Procedures Procedures (including critical care time)  Medications Ordered in ED Medications  oxyCODONE-acetaminophen (PERCOCET/ROXICET) 5-325 MG per tablet 1 tablet (1 tablet Oral Given 04/28/19 0646)  HYDROmorphone (DILAUDID) injection 1 mg (1 mg Intravenous Given 04/28/19 0919)  ketorolac (TORADOL) 15 MG/ML injection 15 mg  (15 mg Intravenous Given 04/28/19 04/30/19)   ED Course  I have reviewed the triage vital signs and the nursing notes.  Pertinent labs & imaging results that were available during my care of the patient were reviewed by me and considered in my medical decision making (see chart for details).    MDM Rules/Calculators/A&P  Ms. Lockner has a history of a lumbar spine injury after a waterskiing accident 1 year ago.  She is currently experiencing severe sciatic nerve pain from a L4 nerve root impingement as seen on her MRI from a couple days ago.  She has been seeing her PCP about this, and received a steroid taper that was completed yesterday, which was ineffective.  The patient also currently takes oxycodone as needed as well as muscle relaxants with no relief.  She currently has a neurosurgery appointment on Thursday with Dr. Venetia Constable. The patient currently has no red flag symptoms, denies any bowel or bladder incontinence or any numbness and tingling in her groin region.  IV Dilaudid ordered.  Patient states that she has no pain after receiving 1 mg IV Dilaudid and 50 mg of Toradol.  She states that she would like to go home.  Will provide a 2-day course of Percocet 5-325 mg, which will be enough to cover her neurosurgery appointment on Thursday.  Patient is medically stable for discharge at this time.  Final Clinical Impression(s) / ED Diagnoses Final diagnoses:  Sciatica of left side   Rx / DC Orders ED Discharge Orders         Ordered    oxyCODONE-acetaminophen (PERCOCET) 5-325 MG tablet  Every 6 hours PRN     04/28/19 1027           Earlene Plater, MD 04/28/19 1044    Blanchie Dessert, MD 04/29/19 [phone]

## 2019-04-28 NOTE — Discharge Instructions (Addendum)
Thank you for allowing [place] to take care of you today.  Below is a summary of what we treated:  1.  Leg pain -Your leg pain is likely being caused by a herniating disc that is compressing your nerve.  We gave you some IV Dilaudid and Toradol to treat this. -We will give you a prescription of Percocet to last you until your appointment.  2.  Follow-up -Please follow-up with your neurosurgery appointment on Thursday.  If you had fevers, increased pain, weakness, and loss of control of your bowel or bladders please come back to the ED immediately.

## 2019-04-28 NOTE — ED Triage Notes (Signed)
Patient reports persistent left hip pain radiating to left leg onset last week unrelieved by prescription medications , denies injury or fall , pain increases with movement / changing positions . Denies fever or chills .

## 2019-04-30 ENCOUNTER — Encounter (HOSPITAL_COMMUNITY): Payer: Self-pay | Admitting: Neurological Surgery

## 2019-04-30 ENCOUNTER — Inpatient Hospital Stay (HOSPITAL_COMMUNITY)
Admission: EM | Admit: 2019-04-30 | Discharge: 2019-05-02 | DRG: 519 | Disposition: A | Discharge status: Home / Self Care | Payer: 59 | Attending: Neurological Surgery | Admitting: Neurological Surgery

## 2019-04-30 ENCOUNTER — Emergency Department (HOSPITAL_COMMUNITY)
Admission: EM | Admit: 2019-04-30 | Discharge: 2019-04-30 | Disposition: A | Discharge status: Home / Self Care | Payer: 59 | Source: Home / Self Care | Attending: Emergency Medicine | Admitting: Emergency Medicine

## 2019-04-30 ENCOUNTER — Encounter (HOSPITAL_COMMUNITY): Payer: Self-pay | Admitting: Emergency Medicine

## 2019-04-30 ENCOUNTER — Encounter (HOSPITAL_COMMUNITY): Payer: Self-pay

## 2019-04-30 ENCOUNTER — Other Ambulatory Visit (HOSPITAL_COMMUNITY)
Admission: RE | Admit: 2019-04-30 | Discharge: 2019-04-30 | Disposition: A | Discharge status: Home / Self Care | Payer: 59 | Source: Ambulatory Visit | Attending: Neurological Surgery | Admitting: Neurological Surgery

## 2019-04-30 ENCOUNTER — Other Ambulatory Visit: Payer: Self-pay

## 2019-04-30 DIAGNOSIS — M5116 Intervertebral disc disorders with radiculopathy, lumbar region: Principal | ICD-10-CM | POA: Diagnosis present

## 2019-04-30 DIAGNOSIS — M4856XA Collapsed vertebra, not elsewhere classified, lumbar region, initial encounter for fracture: Secondary | ICD-10-CM | POA: Diagnosis present

## 2019-04-30 DIAGNOSIS — M48061 Spinal stenosis, lumbar region without neurogenic claudication: Secondary | ICD-10-CM | POA: Diagnosis present

## 2019-04-30 DIAGNOSIS — Z79899 Other long term (current) drug therapy: Secondary | ICD-10-CM | POA: Insufficient documentation

## 2019-04-30 DIAGNOSIS — Z20822 Contact with and (suspected) exposure to covid-19: Secondary | ICD-10-CM | POA: Diagnosis present

## 2019-04-30 DIAGNOSIS — Z87440 Personal history of urinary (tract) infections: Secondary | ICD-10-CM | POA: Diagnosis not present

## 2019-04-30 DIAGNOSIS — M5440 Lumbago with sciatica, unspecified side: Secondary | ICD-10-CM

## 2019-04-30 DIAGNOSIS — M5442 Lumbago with sciatica, left side: Secondary | ICD-10-CM | POA: Insufficient documentation

## 2019-04-30 DIAGNOSIS — M479 Spondylosis, unspecified: Secondary | ICD-10-CM | POA: Diagnosis present

## 2019-04-30 DIAGNOSIS — Z9049 Acquired absence of other specified parts of digestive tract: Secondary | ICD-10-CM

## 2019-04-30 DIAGNOSIS — Z8249 Family history of ischemic heart disease and other diseases of the circulatory system: Secondary | ICD-10-CM

## 2019-04-30 DIAGNOSIS — M5416 Radiculopathy, lumbar region: Secondary | ICD-10-CM | POA: Diagnosis present

## 2019-04-30 DIAGNOSIS — Z8349 Family history of other endocrine, nutritional and metabolic diseases: Secondary | ICD-10-CM

## 2019-04-30 DIAGNOSIS — Z419 Encounter for procedure for purposes other than remedying health state, unspecified: Secondary | ICD-10-CM

## 2019-04-30 LAB — SARS CORONAVIRUS 2 (TAT 6-24 HRS): SARS Coronavirus 2: NEGATIVE

## 2019-04-30 MED ORDER — OXYCODONE-ACETAMINOPHEN 5-325 MG PO TABS
2.0000 | ORAL_TABLET | Freq: Once | ORAL | Status: AC
  Administered 2019-04-30: 2 via ORAL
  Filled 2019-04-30: qty 2

## 2019-04-30 MED ORDER — POLYETHYLENE GLYCOL 3350 17 G PO PACK
17.0000 g | PACK | Freq: Every day | ORAL | Status: DC | PRN
  Administered 2019-05-02: 17 g via ORAL
  Filled 2019-04-30: qty 1

## 2019-04-30 MED ORDER — ACETAMINOPHEN 650 MG RE SUPP: 650.0000 mg | RECTAL | Status: DC | PRN

## 2019-04-30 MED ORDER — HYDROMORPHONE HCL 1 MG/ML IJ SOLN
1.0000 mg | INTRAMUSCULAR | Status: DC | PRN
  Administered 2019-05-01 (×3): 1 mg via INTRAVENOUS
  Filled 2019-04-30 (×3): qty 1

## 2019-04-30 MED ORDER — ONDANSETRON HCL 4 MG/2ML IJ SOLN
4.0000 mg | Freq: Four times a day (QID) | INTRAMUSCULAR | Status: DC | PRN
  Administered 2019-05-01: 4 mg via INTRAVENOUS
  Filled 2019-04-30: qty 2

## 2019-04-30 MED ORDER — KETOROLAC TROMETHAMINE 30 MG/ML IJ SOLN
30.0000 mg | Freq: Once | INTRAMUSCULAR | Status: AC
  Administered 2019-04-30: 30 mg via INTRAVENOUS
  Filled 2019-04-30: qty 1

## 2019-04-30 MED ORDER — ONDANSETRON HCL 4 MG PO TABS: 4.0000 mg | ORAL_TABLET | Freq: Four times a day (QID) | ORAL | Status: DC | PRN

## 2019-04-30 MED ORDER — ACETAMINOPHEN 325 MG PO TABS: 650.0000 mg | ORAL_TABLET | ORAL | Status: DC | PRN

## 2019-04-30 MED ORDER — MENTHOL 3 MG MT LOZG: 1.0000 | LOZENGE | OROMUCOSAL | Status: DC | PRN

## 2019-04-30 MED ORDER — OXYCODONE HCL 5 MG PO TABS
5.0000 mg | ORAL_TABLET | ORAL | Status: DC | PRN
  Administered 2019-05-02: 08:00:00 5 mg via ORAL

## 2019-04-30 MED ORDER — SUMATRIPTAN SUCCINATE 100 MG PO TABS: 100.0000 mg | ORAL_TABLET | Freq: Two times a day (BID) | ORAL | Status: DC | PRN

## 2019-04-30 MED ORDER — OXYCODONE HCL 5 MG PO TABS
10.0000 mg | ORAL_TABLET | ORAL | Status: DC | PRN
  Administered 2019-05-01 – 2019-05-02 (×5): 10 mg via ORAL
  Filled 2019-04-30 (×7): qty 2

## 2019-04-30 MED ORDER — CYCLOBENZAPRINE HCL 10 MG PO TABS
10.0000 mg | ORAL_TABLET | Freq: Three times a day (TID) | ORAL | Status: DC | PRN
  Administered 2019-05-01: 22:00:00 10 mg via ORAL
  Filled 2019-04-30: qty 1

## 2019-04-30 MED ORDER — DOCUSATE SODIUM 100 MG PO CAPS
100.0000 mg | ORAL_CAPSULE | Freq: Two times a day (BID) | ORAL | Status: DC
  Administered 2019-05-01 – 2019-05-02 (×2): 100 mg via ORAL
  Filled 2019-04-30 (×2): qty 1

## 2019-04-30 MED ORDER — PHENOL 1.4 % MT LIQD: 1.0000 | OROMUCOSAL | Status: DC | PRN

## 2019-04-30 MED ORDER — SODIUM CHLORIDE 0.9 % IV SOLN: INTRAVENOUS | Status: DC

## 2019-04-30 MED ORDER — CITALOPRAM HYDROBROMIDE 20 MG PO TABS
10.0000 mg | ORAL_TABLET | Freq: Every day | ORAL | Status: DC
  Administered 2019-05-01: 10 mg via ORAL
  Filled 2019-04-30: qty 1

## 2019-04-30 MED ORDER — HYDROMORPHONE HCL 1 MG/ML IJ SOLN
1.0000 mg | Freq: Once | INTRAMUSCULAR | Status: AC
  Administered 2019-04-30: 05:00:00 1 mg via INTRAVENOUS
  Filled 2019-04-30: qty 1

## 2019-04-30 NOTE — ED Provider Notes (Signed)
[facility] EMERGENCY DEPARTMENT Provider Note   CSN: [id number] Arrival date & time: 04/30/19  0419     History Chief Complaint  Patient presents with  . Back Pain  . Hip Pain  . Leg Pain    Maureen Davis is a 59 y.o. female.  Patient presents to the ED with a chief complaint of back pain and leg pain.  She has history of herniated disk at L4/5 with nerve root impingement. Sustained an injury water skiing about a year ago. Patient states that she has been bed bound and taking pain medication continuously with no improvement of her symptoms.  She states that she has an appointment with neurosurgery, Dr. Maurice Small in a few hours at 10am.  She states that she couldn't tolerate the pain and needed to be seen.  She denies bowel or bladder incontinence.  She states that she has some numbness of her left leg mainly around the outside of her upper thigh.  She denies any fevers or chills. Denies any new injuries.  The history is provided by the patient. No language interpreter was used.       Past Medical History:  Diagnosis Date  . Chronic UTI   . Depression   . Frequent UTI    --sees Dr.McDiarmid  . Hypertrophy of uterus   . Leukopenia 2016   WBC 3.8  . Polyp of corpus uteri   . Uterine prolapse     Patient Active Problem List   Diagnosis Date Noted  . DDD (degenerative disc disease), cervical 08/07/2017  . Coccygodynia 08/07/2017  . Hx of migraines 07/18/2017  . History of gastroesophageal reflux (GERD) 07/18/2017  . History of depression 07/18/2017  . Recurrent UTI 07/18/2017  . Leukopenia 07/18/2017    Past Surgical History:  Procedure Laterality Date  . CHOLECYSTECTOMY  1998  . TUBAL LIGATION  1994     OB History    Gravida  3   Para  3   Term  3   Preterm      AB      Living  3     SAB      TAB      Ectopic      Multiple      Live Births              Family History  Problem Relation Age of Onset  . Neuropathy Mother    . Charcot-Marie-Tooth disease Mother   . Hypertension Father   . Osteoarthritis Father   . Thyroid disease Sister        removed   . Healthy Son   . Healthy Son   . Healthy Son     Social History   Tobacco Use  . Smoking status: Never Smoker  . Smokeless tobacco: Never Used  Substance Use Topics  . Alcohol use: Yes    Alcohol/week: 2.0 - 3.0 standard drinks    Types: 2 - 3 Standard drinks or equivalent per week    Comment: occ  . Drug use: Never    Home Medications Prior to Admission medications   Medication Sig Start Date End Date Taking? Authorizing Provider  carisoprodol (SOMA) 350 MG tablet Take 350 mg by mouth every 6 (six) hours as needed for muscle spasms.  05/22/18   [provider]  citalopram (CELEXA) 20 MG tablet Take 10 mg by mouth at bedtime.  12/22/13   [provider]  estradiol (ESTRACE) 0.1 MG/GM vaginal cream Use  1/2 g vaginally every night for the first 2 weeks, then use 1/2 g vaginally two or three times per week as needed to maintain symptom relief. 03/24/19   [name], MD  HYDROcodone-acetaminophen (NORCO/VICODIN) 5-325 MG tablet Take 1 tablet by mouth every 6 (six) hours as needed for pain. 04/20/19   [provider]  ibuprofen (ADVIL) 200 MG tablet Take 400 mg by mouth 2 (two) times daily as needed for moderate pain.    [provider]  oxyCODONE-acetaminophen (PERCOCET) 5-325 MG tablet Take 1 tablet by mouth every 6 (six) hours as needed for up to 2 days for moderate pain or severe pain. 04/28/19 04/30/19  Earlene Plater, MD  SUMAtriptan (IMITREX) 100 MG tablet Take 100 mg by mouth every 2 (two) hours as needed for migraine.  02/11/19   [provider]    Allergies    Patient has no known allergies.  Review of Systems   Review of Systems  All other systems reviewed and are negative.   Physical Exam Updated Vital Signs BP 117/69   Pulse 78   Temp 98.2 F (36.8 C) (Oral)   Resp 15    Ht 5\' 7"  (1.702 m)   Wt 60 kg   LMP 10/20/2014 (Exact Date)   SpO2 96%   BMI 20.72 kg/m   Physical Exam Vitals and nursing note reviewed.  Constitutional:      General: She is not in acute distress.    Appearance: She is well-developed.     Comments: Uncomfortable appearing  HENT:     Head: Normocephalic and atraumatic.  Eyes:     Conjunctiva/sclera: Conjunctivae normal.  Cardiovascular:     Rate and Rhythm: Normal rate and regular rhythm.     Heart sounds: No murmur.  Pulmonary:     Effort: Pulmonary effort is normal. No respiratory distress.     Breath sounds: Normal breath sounds.  Abdominal:     Palpations: Abdomen is soft.     Tenderness: There is no abdominal tenderness.  Musculoskeletal:     Cervical back: Neck supple.     Comments: ROM and strength of left lower extremity is limited by pain, but she is able to flex and extend at the hip, knee, and ankle  Skin:    General: Skin is warm and dry.  Neurological:     Mental Status: She is alert.     Comments: Objective to left lower extremity is intact 3+ reflexes of left patella compared to 2+ on the right  Psychiatric:        Mood and Affect: Mood normal.        Behavior: Behavior normal.     ED Results / Procedures / Treatments   Labs (all labs ordered are listed, but only abnormal results are displayed) Labs Reviewed - No data to display  EKG None  Radiology No results found.  Procedures Procedures (including critical care time)  Medications Ordered in ED Medications  HYDROmorphone (DILAUDID) injection 1 mg (1 mg Intravenous Given 04/30/19 0458)  ketorolac (TORADOL) 30 MG/ML injection 30 mg (30 mg Intravenous Given 04/30/19 0500)    ED Course  I have reviewed the triage vital signs and the nursing notes.  Pertinent labs & imaging results that were available during my care of the patient were reviewed by me and considered in my medical decision making (see chart for details).    MDM  Rules/Calculators/A&P  Patient with persistent back pain.  Known herniated disk at L4/5.  Has follow-up today with neurosurgery.  She has worsened pain today, but doesn't have any bowel or bladder incontinence, fever, or strength deficit.  Strength does seem to be somewhat limited by pain.  She complains of numbness sensation, but is able to sense my touch on exam.  I don't think that she needs any repeat imaging.  I think that she needs to see neurosurgery for further treatment and she is seeing them this morning.  Discussed the case with Dr. Clayborne Dana, who recommends pain control and discharge with neurosurg follow-up this morning since there doesn't seem to be any new emergent findings apart from worsening pain.  Final Clinical Impression(s) / ED Diagnoses Final diagnoses:  Acute low back pain with left-sided sciatica, unspecified back pain laterality    Rx / DC Orders ED Discharge Orders    None       Roxy Horseman, PA-C 04/30/19 0535    Mesner, Barbara Cower, MD 04/30/19 [phone]

## 2019-04-30 NOTE — ED Notes (Signed)
Husband at bedside.  

## 2019-04-30 NOTE — ED Notes (Signed)
Pt seen 2 days ago (3/30) for same, DC to follow up this AM

## 2019-04-30 NOTE — Progress Notes (Signed)
Mrs. Maureen Davis denies chest pain or shortness of breath.  Patient was tested for Covid today and is in quarantine with her husband. Mrs. Maureen Davis said that if she has unbearable pain to come to the hospital to be admitted.

## 2019-04-30 NOTE — ED Notes (Signed)
Patient verbalizes understanding of discharge instructions. Opportunity for questioning and answers were provided.Pt discharged from ED stable & ambulatory with husband. 

## 2019-04-30 NOTE — Discharge Instructions (Addendum)
Return to the ER for fever, weakness/paralysis, or incontinence.  Otherwise, please see the specialist today as planned.

## 2019-04-30 NOTE — ED Triage Notes (Signed)
Pt scheduled to have lumbar laminectomy/decompression tomorrow AM with Dr Maurice Small. Pt reports severe back pain, was instructed to come here for pain control overnight until her surgery in the morning. Has Rx for Oxycodone 10mg , states her last dose was 5PM.

## 2019-04-30 NOTE — H&P (Signed)
Neurosurgery H&P  CC: Leg pain and weakness  HPI: This is a 59 y.o. woman that presented to my clinic today with rapidly progressive severe back and left lower extremity pain with numbness and foot weakness. She has been non-ambulatory due to severe pain, was not able to detect any focal weakness, but she had fairly significant weakness of her left EHL with pain and numbness in the left L5 distribution. She had some overflow incontinence that since this began, she has had 2 vaginal deliveries in the past but normally she does not have any urinary incontinence. No bowel incontinence but she does endorse being constipated for the past few days. No recent use of anti-platelet or anti-coagulant medications.   ROS: A 14 point ROS was performed and is negative except as noted in the HPI.   PMHx:  Past Medical History:  Diagnosis Date  . Arthritis    osteo  . Chronic UTI   . Depression   . Frequent UTI    --sees Dr.McDiarmid  . Headache    migraines went away with ment  . Hypertrophy of uterus   . Leukopenia 2016   WBC 3.8  . Polyp of corpus uteri   . Uterine prolapse    FamHx:  Family History  Problem Relation Age of Onset  . Neuropathy Mother   . Charcot-Marie-Tooth disease Mother   . Hypertension Father   . Osteoarthritis Father   . Thyroid disease Sister        removed   . Healthy Son   . Healthy Son   . Healthy Son    SocHx:  reports that she has never smoked. She has never used smokeless tobacco. She reports current alcohol use of about 2.0 - 3.0 standard drinks of alcohol per week. She reports that she does not use drugs.  Exam: Vital signs in last 24 hours: Temp:  [98.2 F (36.8 C)-98.4 F (36.9 C)] 98.4 F (36.9 C) (04/01 1928) Pulse Rate:  [73-88] 88 (04/01 1928) Resp:  [15-18] 18 (04/01 1928) BP: (113-151)/(62-94) 143/84 (04/01 1928) SpO2:  [94 %-100 %] 97 % (04/01 1928) Weight:  [56.7 kg-60 kg] 56.7 kg (04/01 1930)   General: Awake, alert, cooperative, lying  in bed, appears to be in severe pain and very uncomfortable Head: normocephalic and atruamatic HEENT: neck supple Pulmonary: breathing room air comfortably, no evidence of increased work of breathing Cardiac: RRR Abdomen: S NT ND Extremities: warm and well perfused x4 Neuro: AOx3, PERRL, EOMI, FS Strength 5/5 x4 except 1/5 strength in the left EHL, SILT except for left L5 numbness   Assessment and Plan: 59 y.o. woman with acute onset severe radicular pain with significant left EHL weakness. MRI with corresponding large left paracentral L4-5 disc herniation with nerve root compression.   -admit to my service, given her non-ambulatory status and significant foot weakness, will take to the OR tomorrow for MIS discectomy -NPO p MN  Jadene Pierini, MD 04/30/19 9:21 PM [facility]

## 2019-04-30 NOTE — ED Triage Notes (Signed)
Pt returns with complaint of lower herniated disk (L4-L5), continued pain, reports taking 3 oxycontin since 2200. Says she has appt at 10am with [facility], but could not wait. Pt's stated pain is 10/10

## 2019-05-01 ENCOUNTER — Encounter (HOSPITAL_COMMUNITY)
Admission: EM | Disposition: A | Discharge status: Home / Self Care | Payer: Self-pay | Source: Home / Self Care | Attending: Neurological Surgery

## 2019-05-01 ENCOUNTER — Inpatient Hospital Stay (HOSPITAL_COMMUNITY): Admission: RE | Admit: 2019-05-01 | Payer: 59 | Source: Home / Self Care | Admitting: Neurological Surgery

## 2019-05-01 ENCOUNTER — Encounter (HOSPITAL_COMMUNITY): Payer: Self-pay | Admitting: Neurological Surgery

## 2019-05-01 ENCOUNTER — Inpatient Hospital Stay (HOSPITAL_COMMUNITY): Payer: 59 | Admitting: Certified Registered"

## 2019-05-01 ENCOUNTER — Inpatient Hospital Stay (HOSPITAL_COMMUNITY): Payer: 59

## 2019-05-01 HISTORY — PX: LUMBAR LAMINECTOMY/ DECOMPRESSION WITH MET-RX: SHX5959

## 2019-05-01 HISTORY — DX: Headache, unspecified: R51.9

## 2019-05-01 HISTORY — DX: Unspecified osteoarthritis, unspecified site: M19.90

## 2019-05-01 LAB — COMPREHENSIVE METABOLIC PANEL
ALT: 124 U/L — ABNORMAL HIGH (ref 0–44)
AST: 64 U/L — ABNORMAL HIGH (ref 15–41)
Albumin: 4 g/dL (ref 3.5–5.0)
Alkaline Phosphatase: 56 U/L (ref 38–126)
Anion gap: 10 (ref 5–15)
BUN: 15 mg/dL (ref 6–20)
CO2: 28 mmol/L (ref 22–32)
Calcium: 9.1 mg/dL (ref 8.9–10.3)
Chloride: 96 mmol/L — ABNORMAL LOW (ref 98–111)
Creatinine, Ser: 0.69 mg/dL (ref 0.44–1.00)
GFR calc Af Amer: >60 mL/min (ref >60)
GFR calc non Af Amer: >60 mL/min (ref >60)
Glucose, Bld: 107 mg/dL — ABNORMAL HIGH (ref 70–99)
Potassium: 3.7 mmol/L (ref 3.5–5.1)
Sodium: 134 mmol/L — ABNORMAL LOW (ref 135–145)
Total Bilirubin: 0.7 mg/dL (ref 0.3–1.2)
Total Protein: 6.2 g/dL — ABNORMAL LOW (ref 6.5–8.1)

## 2019-05-01 LAB — CBC WITH DIFFERENTIAL/PLATELET
Abs Immature Granulocytes: 0.03 10*3/uL (ref 0.00–0.07)
Basophils Absolute: 0 10*3/uL (ref 0.0–0.1)
Basophils Relative: 1 %
Eosinophils Absolute: 0.2 10*3/uL (ref 0.0–0.5)
Eosinophils Relative: 4 %
HCT: 39.7 % (ref 36.0–46.0)
Hemoglobin: 13 g/dL (ref 12.0–15.0)
Immature Granulocytes: 1 %
Lymphocytes Relative: 22 %
Lymphs Abs: 1.2 10*3/uL (ref 0.7–4.0)
MCH: 30 pg (ref 26.0–34.0)
MCHC: 32.7 g/dL (ref 30.0–36.0)
MCV: 91.5 fL (ref 80.0–100.0)
Monocytes Absolute: 0.7 10*3/uL (ref 0.1–1.0)
Monocytes Relative: 12 %
Neutro Abs: 3.4 10*3/uL (ref 1.7–7.7)
Neutrophils Relative %: 60 %
Platelets: 200 10*3/uL (ref 150–400)
RBC: 4.34 MIL/uL (ref 3.87–5.11)
RDW: 12.6 % (ref 11.5–15.5)
WBC: 5.6 10*3/uL (ref 4.0–10.5)
nRBC: 0 % (ref 0.0–0.2)

## 2019-05-01 LAB — SURGICAL PCR SCREEN
MRSA, PCR: NEGATIVE
Staphylococcus aureus: NEGATIVE

## 2019-05-01 SURGERY — LUMBAR LAMINECTOMY/ DECOMPRESSION WITH MET-RX
Anesthesia: General | Laterality: Left

## 2019-05-01 MED ORDER — SUGAMMADEX SODIUM 200 MG/2ML IV SOLN
INTRAVENOUS | Status: DC | PRN
  Administered 2019-05-01: 200 mg via INTRAVENOUS

## 2019-05-01 MED ORDER — CEFAZOLIN SODIUM-DEXTROSE 2-3 GM-%(50ML) IV SOLR
INTRAVENOUS | Status: DC | PRN
  Administered 2019-05-01: 2 g via INTRAVENOUS

## 2019-05-01 MED ORDER — ONDANSETRON HCL 4 MG/2ML IJ SOLN
4.0000 mg | Freq: Once | INTRAMUSCULAR | Status: AC | PRN
  Administered 2019-05-01: 4 mg via INTRAVENOUS

## 2019-05-01 MED ORDER — METHYLPREDNISOLONE ACETATE 80 MG/ML IJ SUSP
INTRAMUSCULAR | Status: AC
  Filled 2019-05-01: qty 1

## 2019-05-01 MED ORDER — PHENYLEPHRINE HCL-NACL 10-0.9 MG/250ML-% IV SOLN
INTRAVENOUS | Status: DC | PRN
  Administered 2019-05-01: 40 ug/min via INTRAVENOUS

## 2019-05-01 MED ORDER — LACTATED RINGERS IV SOLN: INTRAVENOUS | Status: DC

## 2019-05-01 MED ORDER — DEXMEDETOMIDINE HCL 200 MCG/2ML IV SOLN
INTRAVENOUS | Status: DC | PRN
  Administered 2019-05-01: 30 ug via INTRAVENOUS

## 2019-05-01 MED ORDER — LIDOCAINE-EPINEPHRINE 1 %-1:100000 IJ SOLN
INTRAMUSCULAR | Status: AC
  Filled 2019-05-01: qty 1

## 2019-05-01 MED ORDER — PHENYLEPHRINE 40 MCG/ML (10ML) SYRINGE FOR IV PUSH (FOR BLOOD PRESSURE SUPPORT)
PREFILLED_SYRINGE | INTRAVENOUS | Status: AC
  Filled 2019-05-01: qty 10

## 2019-05-01 MED ORDER — FENTANYL CITRATE (PF) 250 MCG/5ML IJ SOLN
INTRAMUSCULAR | Status: AC
  Filled 2019-05-01: qty 5

## 2019-05-01 MED ORDER — FENTANYL CITRATE (PF) 100 MCG/2ML IJ SOLN
INTRAMUSCULAR | Status: DC | PRN
  Administered 2019-05-01: 100 ug via INTRAVENOUS
  Administered 2019-05-01: 50 ug via INTRAVENOUS

## 2019-05-01 MED ORDER — SODIUM CHLORIDE 0.9 % IV SOLN
INTRAVENOUS | Status: DC | PRN
  Administered 2019-05-01: 10:00:00 500 mL

## 2019-05-01 MED ORDER — 0.9 % SODIUM CHLORIDE (POUR BTL) OPTIME
TOPICAL | Status: DC | PRN
  Administered 2019-05-01: 1000 mL

## 2019-05-01 MED ORDER — ONDANSETRON HCL 4 MG/2ML IJ SOLN
INTRAMUSCULAR | Status: DC | PRN
  Administered 2019-05-01: 4 mg via INTRAVENOUS

## 2019-05-01 MED ORDER — LIDOCAINE-EPINEPHRINE 1 %-1:100000 IJ SOLN
INTRAMUSCULAR | Status: DC | PRN
  Administered 2019-05-01: 10 mL

## 2019-05-01 MED ORDER — ROCURONIUM BROMIDE 50 MG/5ML IV SOSY
PREFILLED_SYRINGE | INTRAVENOUS | Status: DC | PRN
  Administered 2019-05-01: 50 mg via INTRAVENOUS

## 2019-05-01 MED ORDER — ONDANSETRON HCL 4 MG/2ML IJ SOLN
INTRAMUSCULAR | Status: AC
  Filled 2019-05-01: qty 2

## 2019-05-01 MED ORDER — MIDAZOLAM HCL 2 MG/2ML IJ SOLN
INTRAMUSCULAR | Status: AC
  Filled 2019-05-01: qty 2

## 2019-05-01 MED ORDER — DEXAMETHASONE SODIUM PHOSPHATE 10 MG/ML IJ SOLN
INTRAMUSCULAR | Status: AC
  Filled 2019-05-01: qty 1

## 2019-05-01 MED ORDER — OXYCODONE HCL 5 MG PO TABS: 5.0000 mg | ORAL_TABLET | Freq: Once | ORAL | Status: DC | PRN

## 2019-05-01 MED ORDER — DEXAMETHASONE SODIUM PHOSPHATE 10 MG/ML IJ SOLN
INTRAMUSCULAR | Status: DC | PRN
  Administered 2019-05-01: 8 mg via INTRAVENOUS

## 2019-05-01 MED ORDER — MUPIROCIN 2 % EX OINT
1.0000 "application " | TOPICAL_OINTMENT | Freq: Two times a day (BID) | CUTANEOUS | Status: DC
  Administered 2019-05-01: 1 via NASAL
  Filled 2019-05-01: qty 22

## 2019-05-01 MED ORDER — LACTATED RINGERS IV SOLN: INTRAVENOUS | Status: DC | PRN

## 2019-05-01 MED ORDER — THROMBIN 5000 UNITS EX SOLR
OROMUCOSAL | Status: DC | PRN
  Administered 2019-05-01: 5 mL via TOPICAL

## 2019-05-01 MED ORDER — PROPOFOL 10 MG/ML IV BOLUS
INTRAVENOUS | Status: AC
  Filled 2019-05-01: qty 20

## 2019-05-01 MED ORDER — GLYCOPYRROLATE PF 0.2 MG/ML IJ SOSY
PREFILLED_SYRINGE | INTRAMUSCULAR | Status: DC | PRN
  Administered 2019-05-01: .1 mg via INTRAVENOUS

## 2019-05-01 MED ORDER — PHENYLEPHRINE 40 MCG/ML (10ML) SYRINGE FOR IV PUSH (FOR BLOOD PRESSURE SUPPORT)
PREFILLED_SYRINGE | INTRAVENOUS | Status: DC | PRN
  Administered 2019-05-01 (×2): 80 ug via INTRAVENOUS

## 2019-05-01 MED ORDER — GLYCOPYRROLATE PF 0.2 MG/ML IJ SOSY
PREFILLED_SYRINGE | INTRAMUSCULAR | Status: AC
  Filled 2019-05-01: qty 1

## 2019-05-01 MED ORDER — FENTANYL CITRATE (PF) 100 MCG/2ML IJ SOLN
INTRAMUSCULAR | Status: AC
  Filled 2019-05-01: qty 2

## 2019-05-01 MED ORDER — FENTANYL CITRATE (PF) 100 MCG/2ML IJ SOLN
50.0000 ug | INTRAMUSCULAR | Status: AC | PRN
  Administered 2019-05-01 (×2): 50 ug via INTRAVENOUS

## 2019-05-01 MED ORDER — LIDOCAINE 2% (20 MG/ML) 5 ML SYRINGE
INTRAMUSCULAR | Status: DC | PRN
  Administered 2019-05-01: 60 mg via INTRAVENOUS

## 2019-05-01 MED ORDER — OXYCODONE HCL 5 MG/5ML PO SOLN: 5.0000 mg | Freq: Once | ORAL | Status: DC | PRN

## 2019-05-01 MED ORDER — THROMBIN 5000 UNITS EX SOLR
CUTANEOUS | Status: AC
  Filled 2019-05-01: qty 5000

## 2019-05-01 MED ORDER — ROCURONIUM BROMIDE 10 MG/ML (PF) SYRINGE
PREFILLED_SYRINGE | INTRAVENOUS | Status: AC
  Filled 2019-05-01: qty 10

## 2019-05-01 MED ORDER — PROPOFOL 10 MG/ML IV BOLUS
INTRAVENOUS | Status: DC | PRN
  Administered 2019-05-01: 120 mg via INTRAVENOUS

## 2019-05-01 MED ORDER — METHYLPREDNISOLONE ACETATE 80 MG/ML IJ SUSP
INTRAMUSCULAR | Status: DC | PRN
  Administered 2019-05-01: 80 mg

## 2019-05-01 MED ORDER — MIDAZOLAM HCL 5 MG/5ML IJ SOLN
INTRAMUSCULAR | Status: DC | PRN
  Administered 2019-05-01: 2 mg via INTRAVENOUS

## 2019-05-01 MED ORDER — LIDOCAINE 2% (20 MG/ML) 5 ML SYRINGE
INTRAMUSCULAR | Status: AC
  Filled 2019-05-01: qty 5

## 2019-05-01 MED ORDER — FENTANYL CITRATE (PF) 100 MCG/2ML IJ SOLN
25.0000 ug | INTRAMUSCULAR | Status: DC | PRN
  Administered 2019-05-01: 13:00:00 50 ug via INTRAVENOUS

## 2019-05-01 SURGICAL SUPPLY — 52 items
BAG DECANTER FOR FLEXI CONT (MISCELLANEOUS) ×3 IMPLANT
BLADE CLIPPER SURG (BLADE) IMPLANT
BLADE SURG 11 STRL SS (BLADE) ×3 IMPLANT
BUR MATCHSTICK NEURO 3.0 LAGG (BURR) ×3 IMPLANT
BUR PRECISION FLUTE 5.0 (BURR) IMPLANT
CANISTER SUCT 3000ML PPV (MISCELLANEOUS) ×3 IMPLANT
COVER WAND RF STERILE (DRAPES) ×3 IMPLANT
DECANTER SPIKE VIAL GLASS SM (MISCELLANEOUS) ×3 IMPLANT
DERMABOND ADVANCED (GAUZE/BANDAGES/DRESSINGS) ×2
DERMABOND ADVANCED .7 DNX12 (GAUZE/BANDAGES/DRESSINGS) ×1 IMPLANT
DRAPE C-ARM 42X72 X-RAY (DRAPES) ×6 IMPLANT
DRAPE LAPAROTOMY 100X72X124 (DRAPES) ×3 IMPLANT
DRAPE MICROSCOPE LEICA (MISCELLANEOUS) ×3 IMPLANT
DRAPE SURG 17X23 STRL (DRAPES) ×3 IMPLANT
DURAPREP 26ML APPLICATOR (WOUND CARE) ×3 IMPLANT
ELECT BLADE 6.5 EXT (BLADE) ×3 IMPLANT
ELECT REM PT RETURN 9FT ADLT (ELECTROSURGICAL) ×3
ELECTRODE REM PT RTRN 9FT ADLT (ELECTROSURGICAL) ×1 IMPLANT
GAUZE 4X4 16PLY RFD (DISPOSABLE) IMPLANT
GAUZE SPONGE 4X4 12PLY STRL (GAUZE/BANDAGES/DRESSINGS) IMPLANT
GLOVE BIO SURGEON STRL SZ7.5 (GLOVE) ×3 IMPLANT
GLOVE BIOGEL PI IND STRL 7.5 (GLOVE) ×1 IMPLANT
GLOVE BIOGEL PI INDICATOR 7.5 (GLOVE) ×2
GLOVE ECLIPSE 8.5 STRL (GLOVE) ×3 IMPLANT
GLOVE EXAM NITRILE LRG STRL (GLOVE) IMPLANT
GLOVE EXAM NITRILE XL STR (GLOVE) IMPLANT
GLOVE EXAM NITRILE XS STR PU (GLOVE) IMPLANT
GLOVE INDICATOR 8.5 STRL (GLOVE) ×3 IMPLANT
GOWN STRL REUS W/ TWL LRG LVL3 (GOWN DISPOSABLE) ×2 IMPLANT
GOWN STRL REUS W/ TWL XL LVL3 (GOWN DISPOSABLE) IMPLANT
GOWN STRL REUS W/TWL 2XL LVL3 (GOWN DISPOSABLE) ×3 IMPLANT
GOWN STRL REUS W/TWL LRG LVL3 (GOWN DISPOSABLE) ×4
GOWN STRL REUS W/TWL XL LVL3 (GOWN DISPOSABLE)
HEMOSTAT POWDER KIT SURGIFOAM (HEMOSTASIS) ×3 IMPLANT
KIT BASIN OR (CUSTOM PROCEDURE TRAY) ×3 IMPLANT
KIT TURNOVER KIT B (KITS) ×3 IMPLANT
NEEDLE HYPO 18GX1.5 BLUNT FILL (NEEDLE) ×3 IMPLANT
NEEDLE HYPO 22GX1.5 SAFETY (NEEDLE) ×3 IMPLANT
NEEDLE SPNL 18GX3.5 QUINCKE PK (NEEDLE) ×3 IMPLANT
NS IRRIG 1000ML POUR BTL (IV SOLUTION) ×3 IMPLANT
PACK LAMINECTOMY NEURO (CUSTOM PROCEDURE TRAY) ×3 IMPLANT
PAD ARMBOARD 7.5X6 YLW CONV (MISCELLANEOUS) ×9 IMPLANT
RUBBERBAND STERILE (MISCELLANEOUS) ×6 IMPLANT
SPONGE LAP 4X18 RFD (DISPOSABLE) IMPLANT
SUT MNCRL AB 3-0 PS2 18 (SUTURE) ×3 IMPLANT
SUT VIC AB 0 CT1 18XCR BRD8 (SUTURE) IMPLANT
SUT VIC AB 0 CT1 8-18 (SUTURE)
SUT VIC AB 2-0 CP2 18 (SUTURE) ×3 IMPLANT
SYR 3ML LL SCALE MARK (SYRINGE) ×3 IMPLANT
TOWEL GREEN STERILE (TOWEL DISPOSABLE) ×3 IMPLANT
TOWEL GREEN STERILE FF (TOWEL DISPOSABLE) ×3 IMPLANT
WATER STERILE IRR 1000ML POUR (IV SOLUTION) ×3 IMPLANT

## 2019-05-01 NOTE — Op Note (Signed)
PATIENT: Maureen Davis  DAY OF SURGERY: 05/01/19   PRE-OPERATIVE DIAGNOSIS:  Herniated nucleus pulposus with lumbar radiculopathy   POST-OPERATIVE DIAGNOSIS:  Herniated nucleus pulposus with lumbar radiculopathy   PROCEDURE:  Left minimally invasive L4-L5 discectomy   SURGEON:  Surgeon(s) and Role:    Jadene Pierini, MD - Primary     Barnett Abu, MD - Assistant   ANESTHESIA: ETGA   BRIEF HISTORY: This is a 59 year old woman who presented with severe back and leg pain that rendered her non-ambulatory. She came into my clinic yesterday and was noted to have significant weakness in her left EHL, MRI showed a corresponding disc herniation and and nerve root compression. I therefore recommended a minimally invasive L4-L5 discectomy. This was discussed with the patient as well as risks, benefits, and alternatives and the patient wished to proceed with surgical treatment.    OPERATIVE DETAIL: The patient was taken to the operating room and placed on the OR table in the prone position. A formal time out was performed with two patient identifiers and confirmed the operative site. Anesthesia was induced by the anesthesia team. The operative site was marked, hair was clipped with surgical clippers, the area was then prepped and draped in a sterile fashion. Fluoroscopy was used to identify the surgical level prior to incision.   A 2cm incision was then marked 1cm off to the left of midline. The fascia was incised sharply and serial dilators were docked to the lamino-facet junction using fluoroscopy to confirm position as well as perform a second count to confirm the correct surgical level. After a final dilator was placed, a tubular retractor was placed over this and secured to the table. The operating microscope was draped and brought into the field. Anatomy was palpated and confirmed, monopolar cautery was used to expose the facet, lamina, and a portion of the spinous process to confirm  orientation. A high speed drill and kerrison rongeurs were then used to create a hemilaminotomy and small medial facetectomy. The ligamentum flavum was resected and the thecal sac and traversing nerve root were identified. Dr. Danielle Dess scrubbed in to assist with retraction of the nerve root and removal of the offending disc fragments. A large disc herniation was clearly present and compressing the traversing root. The disc was mobilized off of the traversing root and reduced into the disc space. The traversing nerve root was then gently retracted medially to expose the disc space using a suction-retractor.  An annulotomy was created sharply and large, free disc fragments were removed. The annulotomy was explored and additional free fragments were removed. The traversing nerve root was palpated throughout its visible course to confirm good decompression. I was able to palpate the traversing root down into its foramen without any evidence of residual compression.  Hemostasis was obtained, the wound was copiously irrigated, depo medrol was placed along the traversing root, and the tube was removed while using the microscope to confirm hemostasis of the muscle edges. All instrument and sponge counts were correct and the incision was then closed in layers. The patient was then returned to anesthesia for emergence. No apparent complications at the completion of the procedure.    EBL:  30mL   DRAINS: none   SPECIMENS: none   Jadene Pierini, MD 05/01/19 9:21 AM

## 2019-05-01 NOTE — Brief Op Note (Signed)
04/30/2019 - 05/01/2019  11:39 AM  PATIENT:  Maureen Davis  59 y.o. female  PRE-OPERATIVE DIAGNOSIS:  Lumbar radiculopathy  POST-OPERATIVE DIAGNOSIS:  Lumbar radiculopathy  PROCEDURE:  Procedure(s): LUMBAR FOUR-FIVE LAMINECTOMY/ DECOMPRESSION WITH MET-RX (Left)  SURGEON:  Surgeon(s) and Role:    * Lakenzie Mcclafferty, Joyice Faster, MD - Primary    * Kristeen Miss, MD - Assisting  PHYSICIAN ASSISTANT:   ANESTHESIA:   general  EBL:  20cc  BLOOD ADMINISTERED:none  DRAINS: none   LOCAL MEDICATIONS USED:  LIDOCAINE   SPECIMEN:  No Specimen  DISPOSITION OF SPECIMEN:  N/A  COUNTS:  YES  TOURNIQUET:  * No tourniquets in log *  DICTATION: .Note written in EPIC  PLAN OF CARE: Admit for overnight observation  PATIENT DISPOSITION:  PACU - hemodynamically stable.   Delay start of Pharmacological VTE agent (>24hrs) due to surgical blood loss or risk of bleeding: yes

## 2019-05-01 NOTE — ED Provider Notes (Signed)
[facility]  [facility] Provider Note   CSN: [id number] Arrival date & time: 04/30/19  1922     History No chief complaint on file.   Maureen Davis is a 59 y.o. female.   Back Pain Location:  Generalized Quality:  Shooting, stabbing and aching Pain severity:  Mild Timing:  Constant Progression:  Worsening Chronicity:  New Relieved by:  None tried Worsened by:  Nothing Ineffective treatments:  Ibuprofen and narcotics Associated symptoms: no abdominal pain        Past Medical History:  Diagnosis Date  . Arthritis    osteo  . Chronic UTI   . Depression   . Frequent UTI    --sees Dr.McDiarmid  . Headache    migraines went away with ment  . Hypertrophy of uterus   . Leukopenia 2016   WBC 3.8  . Polyp of corpus uteri   . Uterine prolapse     Patient Active Problem List   Diagnosis Date Noted  . Lumbar radiculopathy 04/30/2019  . DDD (degenerative disc disease), cervical 08/07/2017  . Coccygodynia 08/07/2017  . Hx of migraines 07/18/2017  . History of gastroesophageal reflux (GERD) 07/18/2017  . History of depression 07/18/2017  . Recurrent UTI 07/18/2017  . Leukopenia 07/18/2017    Past Surgical History:  Procedure Laterality Date  . CHOLECYSTECTOMY  1998  . COLONOSCOPY    . CYSTOSCOPY    . TUBAL LIGATION  1994     OB History    Gravida  3   Para  3   Term  3   Preterm      AB      Living  3     SAB      TAB      Ectopic      Multiple      Live Births              Family History  Problem Relation Age of Onset  . Neuropathy Mother   . Charcot-Marie-Tooth disease Mother   . Hypertension Father   . Osteoarthritis Father   . Thyroid disease Sister        removed   . Healthy Son   . Healthy Son   . Healthy Son     Social History   Tobacco Use  . Smoking status: Never Smoker  . Smokeless tobacco: Never Used  Substance Use Topics  . Alcohol use: Yes    Alcohol/week: 2.0 - 3.0 standard drinks     Types: 2 - 3 Standard drinks or equivalent per week    Comment: occ  . Drug use: Never    Home Medications Prior to Admission medications   Medication Sig Start Date End Date Taking? Authorizing Provider  carisoprodol (SOMA) 350 MG tablet Take 350 mg by mouth every 6 (six) hours as needed for muscle spasms.  05/22/18  Yes [provider]  citalopram (CELEXA) 20 MG tablet Take 10 mg by mouth at bedtime.  12/22/13  Yes [provider]  estradiol (ESTRACE) 0.1 MG/GM vaginal cream Use 1/2 g vaginally every night for the first 2 weeks, then use 1/2 g vaginally two or three times per week as needed to maintain symptom relief. 03/24/19  Yes Patton Salles, MD  Oxycodone HCl 10 MG TABS Take 10 mg by mouth every 4 (four) hours as needed (pain).    Yes [provider]  SUMAtriptan (IMITREX) 100 MG tablet Take 100 mg by  mouth every 2 (two) hours as needed for migraine.  02/11/19  Yes [provider]    Allergies    Patient has no known allergies.  Review of Systems   Review of Systems  Gastrointestinal: Negative for abdominal pain.  Musculoskeletal: Positive for back pain.  All other systems reviewed and are negative.   Physical Exam Updated Vital Signs BP 130/78 (BP Location: Left Arm)   Pulse 88   Temp 98.2 F (36.8 C) (Oral)   Resp 18   Ht 5\' 7"  (1.702 m)   Wt 56.7 kg   LMP 10/20/2014 (Exact Date)   SpO2 97%   BMI 19.58 kg/m   Physical Exam Vitals and nursing note reviewed.  Constitutional:      Appearance: She is well-developed.  HENT:     Head: Normocephalic and atraumatic.     Mouth/Throat:     Mouth: Mucous membranes are dry.     Pharynx: Oropharynx is clear.  Eyes:     Conjunctiva/sclera: Conjunctivae normal.  Cardiovascular:     Rate and Rhythm: Normal rate and regular rhythm.  Pulmonary:     Effort: No respiratory distress.     Breath sounds: No stridor.  Abdominal:     General: There is no distension.    Musculoskeletal:        General: No swelling or tenderness. Normal range of motion.     Cervical back: Normal range of motion.  Skin:    General: Skin is warm and dry.  Neurological:     Mental Status: She is alert.     Sensory: Sensory deficit present.     Motor: Weakness present.     Gait: Gait abnormal.     ED Results / Procedures / Treatments   Labs (all labs ordered are listed, but only abnormal results are displayed) Labs Reviewed  SURGICAL PCR SCREEN  CBC WITH DIFFERENTIAL/PLATELET  COMPREHENSIVE METABOLIC PANEL    EKG None  Radiology No results found.  Procedures Procedures (including critical care time)  Medications Ordered in ED Medications  citalopram (CELEXA) tablet 10 mg (10 mg Oral Not Given 05/01/19 0255)  SUMAtriptan (IMITREX) tablet 100 mg (has no administration in time range)  acetaminophen (TYLENOL) tablet 650 mg (has no administration in time range)    Or  acetaminophen (TYLENOL) suppository 650 mg (has no administration in time range)  oxyCODONE (Oxy IR/ROXICODONE) immediate release tablet 5 mg (has no administration in time range)  oxyCODONE (Oxy IR/ROXICODONE) immediate release tablet 10 mg (10 mg Oral Given 05/01/19 0042)  HYDROmorphone (DILAUDID) injection 1 mg (1 mg Intravenous Given 05/01/19 0604)  cyclobenzaprine (FLEXERIL) tablet 10 mg (has no administration in time range)  docusate sodium (COLACE) capsule 100 mg (100 mg Oral Not Given 05/01/19 0255)  polyethylene glycol (MIRALAX / GLYCOLAX) packet 17 g (has no administration in time range)  ondansetron (ZOFRAN) tablet 4 mg ( Oral See Alternative 05/01/19 0017)    Or  ondansetron (ZOFRAN) injection 4 mg (4 mg Intravenous Given 05/01/19 0017)  menthol-cetylpyridinium (CEPACOL) lozenge 3 mg (has no administration in time range)    Or  phenol (CHLORASEPTIC) mouth spray 1 spray (has no administration in time range)  0.9 %  sodium chloride infusion ( Intravenous Transfusing/Transfer 05/01/19 0211)   mupirocin ointment (BACTROBAN) 2 % 1 application (1 application Nasal Given 05/01/19 0252)  oxyCODONE-acetaminophen (PERCOCET/ROXICET) 5-325 MG per tablet 2 tablet (2 tablets Oral Given 04/30/19 2332)    ED Course  I have reviewed the triage vital signs  and the nursing notes.  Pertinent labs & imaging results that were available during my care of the patient were reviewed by me and considered in my medical decision making (see chart for details).    MDM Rules/Calculators/A&P                      nsg already seen. Here with worsening nerve compression. Plan for admit for pain control pending surgery.   Final Clinical Impression(s) / ED Diagnoses Final diagnoses:  Acute bilateral low back pain with sciatica, sciatica laterality unspecified    Rx / DC Orders ED Discharge Orders    None       Marty Sadlowski, Barbara Cower, MD 05/01/19 [phone]

## 2019-05-01 NOTE — Discharge Summary (Signed)
Discharge Summary  Date of Admission: 04/30/2019  Date of Discharge: 05/02/19 Attending Physician: Autumn Patty, MD  Hospital Course: Patient went to the ED for severe low back and leg pain that rendered her non-ambulatory with left foot weakness. An MRI showed a correlating large L4-5 disc herniation. She was taken to the OR on 05/01/19 for a left MIS L4-5 microdiscectomy. She was recovered in PACU and transferred to [facility]. Her hospital course was uncomplicated and the patient was discharged home on 05/02/2019. She will follow up in clinic with me in 2 weeks.  Neurologic exam at discharge:  AOx3, PERRL, EOMI, FS, TM Strength 5/5 x4 except for L EHL 3/5, SILTx4 except for L L5 numbness  Discharge diagnosis: Lumbar radiculopathy  Jadene Pierini, MD 05/01/19 9:27 AM

## 2019-05-01 NOTE — Progress Notes (Signed)
OT Cancellation Note  Patient Details Name: Maureen Davis MRN: [id number] DOB: 08/30/1960   Cancelled Treatment:    Reason Eval/Treat Not Completed: Patient at procedure or test/ unavailable(Sx. Will return as schedule allows.)  [facility] MSOT, OTR/L Acute Rehab Pager: [phone] Office: [phone] 05/01/2019, 8:44 AM

## 2019-05-01 NOTE — Progress Notes (Signed)
PT Cancellation Note  Patient Details Name: Maureen Davis MRN: [id number] DOB: [birth year]   Cancelled Treatment:    Reason Eval/Treat Not Completed: Patient at procedure or test/unavailable- will check back as schedule allows.  Richrd Sox, PT Acute Rehabilitation Services Pager [phone]  Office [phone]   Tyrone Apple D Despina Hidden 05/01/2019, 10:36 AM

## 2019-05-01 NOTE — Evaluation (Signed)
Occupational Therapy Evaluation Patient Details Name: Maureen Davis MRN: [id number] DOB: Dec 10, 1960 Today's Date: 05/01/2019    History of Present Illness 59 yo female presenting with severe back pain. s/p L4-5 discectomy on 4/2. PMH including cervical DDD, GERD, depression, and coccygodynia.   Clinical Impression   PTA, pt was living with her husband and was independent. Currently, pt performing ADLs and functional mobility supervision level with RW. Provided handout and education on back precautions, brace management, bed mobility, LB ADLs, toileting, and shower transfer; pt demonstrated and verbalize understanding. Answered all pt questions. Recommend dc home once medically stable per physician. All acute OT needs met and will sign off. Thank you.    Follow Up Recommendations  No OT follow up;Supervision - Intermittent    Equipment Recommendations  3 in 1 bedside commode;Other (comment)(RW)    Recommendations for Other Services PT consult     Precautions / Restrictions Precautions Precautions: Back Precaution Booklet Issued: Yes (comment) Precaution Comments: Reviewed back precautions.  Required Braces or Orthoses: Other Brace Other Brace: No brace per physican order Restrictions Weight Bearing Restrictions: No      Mobility Bed Mobility Overal bed mobility: Needs Assistance Bed Mobility: Rolling;Sidelying to Sit Rolling: Supervision Sidelying to sit: Supervision       General bed mobility comments: Educating pt on log roll technique. Supervision for safety  Transfers Overall transfer level: Needs assistance Equipment used: Rolling walker (2 wheeled) Transfers: Sit to/from Stand Sit to Stand: Supervision         General transfer comment: Supervision for safety    Balance Overall balance assessment: Needs assistance Sitting-balance support: No upper extremity supported;Feet supported Sitting balance-Leahy Scale: Good     Standing balance support: Single  extremity supported;During functional activity;No upper extremity supported Standing balance-Leahy Scale: Fair Standing balance comment: Able to maintain static standing at sink                           ADL either performed or assessed with clinical judgement   ADL Overall ADL's : Needs assistance/impaired                                       General ADL Comments: Pt performing ADLs and functional mobility at Supervision level. Providing education on back precautions, brace management, grooming, LB ADLs, toileting, and shower transfer.     Vision         Perception     Praxis      Pertinent Vitals/Pain Pain Assessment: Faces Faces Pain Scale: Hurts a little bit Pain Location: Back Pain Descriptors / Indicators: Discomfort;Grimacing Pain Intervention(s): Monitored during session     Hand Dominance Right   Extremity/Trunk Assessment Upper Extremity Assessment Upper Extremity Assessment: Overall WFL for tasks assessed   Lower Extremity Assessment Lower Extremity Assessment: Defer to PT evaluation   Cervical / Trunk Assessment Cervical / Trunk Assessment: Other exceptions Cervical / Trunk Exceptions: s/p back sx   Communication Communication Communication: No difficulties   Cognition Arousal/Alertness: Awake/alert Behavior During Therapy: WFL for tasks assessed/performed Overall Cognitive Status: Within Functional Limits for tasks assessed                                     General Comments  Husband present throughout session    Exercises  Shoulder Instructions      Home Living Family/patient expects to be discharged to:: Private residence Living Arrangements: Spouse/significant other Available Help at Discharge: Family;Available PRN/intermittently Type of Home: House Home Access: Stairs to enter [employer] of Steps: 2 Entrance Stairs-Rails: None Home Layout: One level;Laundry or work area in  basement     [employer]: Occupational psychologist: [place]: None          Prior Functioning/Environment Level of Independence: Independent        Comments: Enjoys gardening        OT Problem List: Decreased activity tolerance;Impaired balance (sitting and/or standing);Decreased knowledge of use of DME or AE;Decreased knowledge of precautions;Pain      OT Treatment/Interventions:      OT Goals(Current goals can be found in the care plan section) Acute Rehab OT Goals Patient Stated Goal: Go home OT Goal Formulation: All assessment and education complete, DC therapy  OT Frequency:     Barriers to D/C:            Co-evaluation              AM-PAC OT "6 Clicks" Daily Activity     Outcome Measure Help from another person eating meals?: None Help from another person taking care of personal grooming?: None Help from another person toileting, which includes using toliet, bedpan, or urinal?: None Help from another person bathing (including washing, rinsing, drying)?: None Help from another person to put on and taking off regular upper body clothing?: None Help from another person to put on and taking off regular lower body clothing?: None 6 Click Score: 24   End of Session Equipment Utilized During Treatment: Rolling walker Nurse Communication: Mobility status  Activity Tolerance: Patient tolerated treatment well Patient left: in chair;with call bell/phone within reach;with family/visitor present  OT Visit Diagnosis: Unsteadiness on feet (R26.81);Other abnormalities of gait and mobility (R26.89);Muscle weakness (generalized) (M62.81);Pain Pain - part of body: (Back)                Time: 1470-9295 OT Time Calculation (min): 25 min Charges:  OT General Charges $OT Visit: 1 Visit OT Evaluation $OT Eval Low Complexity: 1 Low OT Treatments $Self Care/Home Management : 8-22 mins  Jaila Schellhorn MSOT, OTR/L Acute  Rehab Pager: [phone] Office: [place] 05/01/2019, 4:48 PM

## 2019-05-01 NOTE — Transfer of Care (Addendum)
Immediate Anesthesia Transfer of Care Note  Patient: Maureen Davis  Procedure(s) Performed: LUMBAR FOUR-FIVE LAMINECTOMY/ DECOMPRESSION WITH MET-RX (Left )  Patient Location: PACU  Anesthesia Type:General  Level of Consciousness: drowsy and patient cooperative  Airway & Oxygen Therapy: Patient Spontanous Breathing and Patient connected to nasal cannula oxygen  Post-op Assessment: Report given to RN, Post -op Vital signs reviewed and stable and Patient moving all extremities X 4  Post vital signs: Reviewed and stable  Last Vitals:  Vitals Value Taken Time  BP 128/79 05/01/19 1150  Temp    Pulse 95 05/01/19 1153  Resp 11 05/01/19 1153  SpO2 100 % 05/01/19 1153  Vitals shown include unvalidated device data.  Last Pain:  Vitals:   05/01/19 0950  TempSrc:   PainSc: 9       Patients Stated Pain Goal: 3 (49/97/18 2099)  Complications: No apparent anesthesia complications

## 2019-05-01 NOTE — Discharge Instructions (Signed)
Discharge Instructions ° °No restriction in activities, slowly increase your activity back to normal.  ° °Your incision is closed with dermabond (purple glue). This will naturally fall off over the next 1-2 weeks.  ° °Okay to shower on the day of discharge. Use regular soap and water and try to be gentle when cleaning your incision.  ° °Follow up with Dr. Denario Bagot in 2 weeks after discharge. If you do not already have a discharge appointment, please call his office at [phone] to schedule a follow up appointment. If you have any concerns or questions, please call the office and let us know. °

## 2019-05-01 NOTE — Anesthesia Preprocedure Evaluation (Signed)

## 2019-05-01 NOTE — Plan of Care (Signed)
  Problem: Safety: Goal: Ability to remain free from injury will improve Outcome: Progressing   Problem: Education: Goal: Knowledge of General Education information will improve Description: Including pain rating scale, medication(s)/side effects and non-pharmacologic comfort measures Outcome: Progressing   Problem: Activity: Goal: Risk for activity intolerance will decrease Outcome: Progressing   Problem: Nutrition: Goal: Adequate nutrition will be maintained Outcome: Progressing

## 2019-05-01 NOTE — ED Notes (Signed)
Pt transported via cart to 3C03C. Pt conscious, breathing, and A&Ox4. No distress noted. All belongings with pt. Pt on monitors.

## 2019-05-01 NOTE — Anesthesia Procedure Notes (Signed)
Procedure Name: Intubation Date/Time: 05/01/2019 10:14 AM Performed by: Julian Reil, CRNA Pre-anesthesia Checklist: Patient identified, Emergency Drugs available, Suction available and Patient being monitored Patient Re-evaluated:Patient Re-evaluated prior to induction Oxygen Delivery Method: Circle system utilized Preoxygenation: Pre-oxygenation with 100% oxygen Induction Type: IV induction Laryngoscope Size: Miller and 3 Grade View: Grade I Tube type: Oral Tube size: 7.0 mm Number of attempts: 1 Airway Equipment and Method: Stylet Placement Confirmation: ETT inserted through vocal cords under direct vision,  positive ETCO2 and breath sounds checked- equal and bilateral Secured at: 21 cm Tube secured with: Tape Dental Injury: Teeth and Oropharynx as per pre-operative assessment  Comments: 4x4s bite block used.

## 2019-05-01 NOTE — Progress Notes (Signed)
Patient ID: Maureen Davis, female   DOB: May 29, 1960, 59 y.o.   MRN: [id number] Patient is awake and alert notes significant decrease in leg pain motor function is slowly returning to the tibialis anterior on the right side.  Dressing is clean and dry.  Stable.

## 2019-05-02 NOTE — Anesthesia Postprocedure Evaluation (Signed)
Anesthesia Post Note  Patient: Maureen Davis  Procedure(s) Performed: LUMBAR FOUR-FIVE LAMINECTOMY/ DECOMPRESSION WITH MET-RX (Left )     Patient location during evaluation: PACU Anesthesia Type: General Level of consciousness: awake and alert Pain management: pain level controlled Vital Signs Assessment: post-procedure vital signs reviewed and stable Respiratory status: spontaneous breathing, nonlabored ventilation, respiratory function stable and patient connected to nasal cannula oxygen Cardiovascular status: blood pressure returned to baseline and stable Postop Assessment: no apparent nausea or vomiting Anesthetic complications: no    Last Vitals:  Vitals:   05/02/19 0401 05/02/19 0741  BP: 116/65 116/71  Pulse: 74 73  Resp: 14 17  Temp: 37.1 C 36.7 C  SpO2: 97% 97%    Last Pain:  Vitals:   05/02/19 1200  TempSrc:   PainSc: 0-No pain                 Livana Yerian COKER

## 2019-05-02 NOTE — Plan of Care (Signed)

## 2019-05-02 NOTE — Progress Notes (Signed)
Provided discharge education/instructions, all questions and concerns addressed, Pt not in distress, to discharge home with belongings once RW is delivered to room.

## 2019-05-02 NOTE — Progress Notes (Signed)
Received referral for RW. Contacted Keon with [employer] for DME referral.

## 2019-05-02 NOTE — Evaluation (Signed)
Physical Therapy Evaluation Patient Details Name: Maureen Davis MRN: [id number] DOB: 1960-10-01 Today's Date: 05/02/2019   History of Present Illness  59 yo female presenting with severe back pain. s/p L4-5 discectomy on 4/2. PMH including cervical DDD, GERD, depression, and coccygodynia.    Clinical Impression  Patient presented sitting in bed, awake, and willing to participate in therapy. PTA, pt was independent in all ADL's. Pt lives with husband in a single level home with 2 steps to enter. At the time of evaluation, pt was able to perform bed mobility w/supervision, she demonstrated proper logroll technique. She stood from EOB and raised toilet at supervision level and required VC for proper RW sequencing and safety. She ambulated ~275f w/RW and supervision. Pt navigated 4 steps with 1 rail (R then L) w/step-to R LE dominant pattern w/min guard for safety. Pt educated on back precautions, walking program, and car transfers. Pt demonstrated and verbalized understanding. Pt is ready to d/c home from PT perspective. All PT needs met and will sign off.     Follow Up Recommendations No PT follow up;Supervision - Intermittent    Equipment Recommendations  Rolling walker with 5" wheels    Recommendations for Other Services       Precautions / Restrictions Precautions Precautions: Back Precaution Comments: Reviewed back precautions.  Other Brace: No brace per physican order Restrictions Weight Bearing Restrictions: No      Mobility  Bed Mobility Overal bed mobility: Needs Assistance Bed Mobility: Rolling;Sidelying to Sit Rolling: Supervision Sidelying to sit: Supervision       General bed mobility comments: Supervision for adherence to back precautions  Transfers Overall transfer level: Needs assistance Equipment used: Rolling walker (2 wheeled) Transfers: Sit to/from Stand Sit to Stand: Supervision         General transfer comment: Supervision for safety. VC for  proper RW sequencing and safety  Ambulation/Gait Ambulation/Gait assistance: Supervision Gait Distance (Feet): 200 Feet Assistive device: Rolling walker (2 wheeled) Gait Pattern/deviations: Step-through pattern;Decreased stride length Gait velocity: dec   General Gait Details: Pt w/good body mechanics w/RW. States she feels like she is "almost back to normal (w/walking)"  Stairs Stairs: Yes Stairs assistance: Min guard Stair Management: One rail Right;One rail Left;Step to pattern;Forwards Number of Stairs: 4 General stair comments: Step-to with R LE dominant. Min guard for safety, no physical assist  Wheelchair Mobility    Modified Rankin (Stroke Patients Only)       Balance Overall balance assessment: Needs assistance Sitting-balance support: No upper extremity supported;Feet supported Sitting balance-Leahy Scale: Good     Standing balance support: Bilateral upper extremity supported;During functional activity;Single extremity supported Standing balance-Leahy Scale: Fair Standing balance comment: Able to stand at stairs with single UE support                             Pertinent Vitals/Pain Pain Assessment: Faces Faces Pain Scale: No hurt    Home Living Family/patient expects to be discharged to:: Private residence Living Arrangements: Spouse/significant other Available Help at Discharge: Family;Available PRN/intermittently Type of Home: House Home Access: Stairs to enter Entrance Stairs-Rails: None Entrance Stairs-Number of Steps: 2 Home Layout: One level;Laundry or work area in [employer] None      Prior Function Level of Independence: Independent         Comments: Enjoys gLawyerDominance   Dominant Hand: Right    Extremity/Trunk Assessment   Upper  Extremity Assessment Upper Extremity Assessment: Defer to OT evaluation    Lower Extremity Assessment Lower Extremity Assessment: Overall WFL for tasks  assessed       Communication   Communication: No difficulties  Cognition Arousal/Alertness: Awake/alert Behavior During Therapy: WFL for tasks assessed/performed Overall Cognitive Status: Within Functional Limits for tasks assessed                                        General Comments      Exercises     Assessment/Plan    PT Assessment Patent does not need any further PT services  PT Problem List         PT Treatment Interventions      PT Goals (Current goals can be found in the Care Plan section)  Acute Rehab PT Goals Patient Stated Goal: To be able to get back to work    Frequency     Barriers to discharge        Co-evaluation               AM-PAC PT "6 Clicks" Mobility  Outcome Measure Help needed turning from your back to your side while in a flat bed without using bedrails?: None Help needed moving from lying on your back to sitting on the side of a flat bed without using bedrails?: None Help needed moving to and from a bed to a chair (including a wheelchair)?: None Help needed standing up from a chair using your arms (e.g., wheelchair or bedside chair)?: None Help needed to walk in hospital room?: None Help needed climbing 3-5 steps with a railing? : A Little 6 Click Score: 23    End of Session Equipment Utilized During Treatment: Gait belt Activity Tolerance: Patient tolerated treatment well Patient left: in chair;with call bell/phone within reach Nurse Communication: Mobility status PT Visit Diagnosis: Other abnormalities of gait and mobility (R26.89)    Time: 0093-8182 PT Time Calculation (min) (ACUTE ONLY): 17 min   Charges:   PT Evaluation $PT Eval Low Complexity: 1 Low         [name], SPT Acute Rehab  [phone]   [name] 05/02/2019, 12:59 PM

## 2019-05-02 NOTE — Progress Notes (Signed)
PT Progress Note for Charges    05/02/19 1200  PT Visit Information  Last PT Received On 05/02/19  PT General Charges  $$ ACUTE PT VISIT 1 Visit  PT Evaluation  $PT Eval Low Complexity 1 Low  Ginette Pitman, PT, DPT  Acute Rehabilitation Services Pager [phone] Office [phone]

## 2019-05-02 NOTE — Progress Notes (Signed)
Pt opted to go home without the RW and agreed to pick it up once delivered. Called Pt's husband regarding delivery of RW to the unit for pick up, left message to voicemail.

## 2019-05-02 NOTE — Plan of Care (Signed)
  Problem: Safety: Goal: Ability to remain free from injury will improve Outcome: Progressing   Problem: Activity: Goal: Risk for activity intolerance will decrease Outcome: Progressing   Problem: Pain Managment: Goal: General experience of comfort will improve Outcome: Progressing   

## 2019-05-06 ENCOUNTER — Other Ambulatory Visit: Payer: 59

## 2019-09-10 ENCOUNTER — Ambulatory Visit: Payer: 59 | Admitting: Rheumatology

## 2019-09-10 NOTE — Progress Notes (Signed)
Office Visit Note  Patient: Maureen Davis             Date of Birth: [birth year]           MRN: [id number]             PCP: Jonathon Jordan, MD Referring: Jonathon Jordan, MD Visit Date: 09/24/2019 Occupation: '@GUAROCC'$ @  Subjective:  Pain in multiple joints.   History of Present Illness: ZILAH VILLAFLOR is a 59 y.o. female with history of osteoarthritis, degenerative disc disease and positive ANA.  She states she had lumbar spine discectomy in April 2021.  She initially had some relief and then the symptoms came back.  She states she has been having left-sided radiculopathy and tingling sensation in bilateral feet.  She also has difficulty lifting her left foot.  She has been experiencing pain in her right TMJ joint, right elbow and bilateral hands.  She has not noticed any joint swelling.  Activities of Daily Living:  Patient reports morning stiffness for 0 minutes.   Patient Reports nocturnal pain.  Difficulty dressing/grooming: Denies Difficulty climbing stairs: Denies Difficulty getting out of chair: Denies Difficulty using hands for taps, buttons, cutlery, and/or writing: Denies  Review of Systems  Constitutional: Negative for fatigue.  HENT: Negative for mouth sores, mouth dryness and nose dryness.   Eyes: Negative for itching and dryness.  Respiratory: Negative for shortness of breath and difficulty breathing.   Cardiovascular: Negative for chest pain and palpitations.  Gastrointestinal: Negative for blood in stool, constipation and diarrhea.  Endocrine: Negative for increased urination.  Genitourinary: Positive for involuntary urination. Negative for difficulty urinating.  Musculoskeletal: Positive for arthralgias, joint pain, myalgias, muscle tenderness and myalgias. Negative for joint swelling and morning stiffness.  Skin: Negative for color change, rash and redness.  Allergic/Immunologic: Negative for susceptible to infections.  Neurological: Positive for dizziness  and numbness. Negative for headaches, memory loss and weakness.  Hematological: Negative for bruising/bleeding tendency.  Psychiatric/Behavioral: Positive for depressed mood. Negative for confusion. The patient is not nervous/anxious.     PMFS History:  Patient Active Problem List   Diagnosis Date Noted  . Lumbar radiculopathy 04/30/2019  . DDD (degenerative disc disease), cervical 08/07/2017  . Coccygodynia 08/07/2017  . Hx of migraines 07/18/2017  . History of gastroesophageal reflux (GERD) 07/18/2017  . History of depression 07/18/2017  . Recurrent UTI 07/18/2017  . Leukopenia 07/18/2017    Past Medical History:  Diagnosis Date  . Arthritis    osteo  . Chronic UTI   . Depression   . Frequent UTI    --sees Dr.McDiarmid  . Headache    migraines went away with ment  . Hypertrophy of uterus   . Leukopenia 2016   WBC 3.8  . Polyp of corpus uteri   . Uterine prolapse     Family History  Problem Relation Age of Onset  . Neuropathy Mother   . Charcot-Marie-Tooth disease Mother   . Hypertension Father   . Osteoarthritis Father   . Thyroid disease Sister        removed   . Healthy Son   . Healthy Son   . Healthy Son    Past Surgical History:  Procedure Laterality Date  . CHOLECYSTECTOMY  1998  . COLONOSCOPY    . CYSTOSCOPY    . LUMBAR LAMINECTOMY/ DECOMPRESSION WITH MET-RX Left 05/01/2019   Procedure: LUMBAR FOUR-FIVE LAMINECTOMY/ DECOMPRESSION WITH MET-RX;  Surgeon: [name], MD;  Location: [facility];  Service: Neurosurgery;  Laterality: Left;  . TUBAL LIGATION  1994   Social History   Social History Narrative  . Not on file   Immunization History  Administered Date(s) Administered  . PFIZER SARS-COV-2 Vaccination 06/07/2019, 06/28/2019     Objective: Vital Signs: BP 102/67 (BP Location: Left Arm, Patient Position: Sitting, Cuff Size: Normal)   Pulse 69   Resp 13   Ht '5\' 7"'$  (1.702 m)   Wt 126 lb 3.2 oz (57.2 kg)   LMP 10/20/2014 (Exact Date)   BMI  19.77 kg/m    Physical Exam Vitals and nursing note reviewed.  Constitutional:      Appearance: She is well-developed.  HENT:     Head: Normocephalic and atraumatic.  Eyes:     Conjunctiva/sclera: Conjunctivae normal.  Cardiovascular:     Rate and Rhythm: Normal rate and regular rhythm.     Heart sounds: Normal heart sounds.  Pulmonary:     Effort: Pulmonary effort is normal.     Breath sounds: Normal breath sounds.  Abdominal:     General: Bowel sounds are normal.     Palpations: Abdomen is soft.  Musculoskeletal:     Cervical back: Normal range of motion.  Lymphadenopathy:     Cervical: No cervical adenopathy.  Skin:    General: Skin is warm and dry.     Capillary Refill: Capillary refill takes less than 2 seconds.     Comments: She has fine petechiae on her bilateral lower extremities consistent with Shamburger's purpura.  Neurological:     Mental Status: She is alert and oriented to person, place, and time.  Psychiatric:        Behavior: Behavior normal.      Musculoskeletal Exam: She has limited range of motion of her cervical spine.  She has discomfort in the thoracic spine.  She has limited painful range of motion of her lumbar spine.  Shoulder joints, elbow joints, wrist joints, MCPs PIPs and DIPs been good range of motion with no synovitis.  Hip joints, knee joints, ankles, MTPs and PIPs with good range of motion with no synovitis.  CDAI Exam: CDAI Score: -- Patient Global: --; Provider Global: -- Swollen: --; Tender: -- Joint Exam 09/24/2019   No joint exam has been documented for this visit   There is currently no information documented on the homunculus. Go to the Rheumatology activity and complete the homunculus joint exam.  Investigation: No additional findings.  Imaging: No results found.  Recent Labs: Lab Results  Component Value Date   WBC 5.6 05/01/2019   HGB 13.0 05/01/2019   PLT 200 05/01/2019   NA 134 (L) 05/01/2019   K 3.7 05/01/2019    CL 96 (L) 05/01/2019   CO2 28 05/01/2019   GLUCOSE 107 (H) 05/01/2019   BUN 15 05/01/2019   CREATININE 0.69 05/01/2019   BILITOT 0.7 05/01/2019   ALKPHOS 56 05/01/2019   AST 64 (H) 05/01/2019   ALT 124 (H) 05/01/2019   PROT 6.2 (L) 05/01/2019   ALBUMIN 4.0 05/01/2019   CALCIUM 9.1 05/01/2019   GFRAA >60 05/01/2019    Speciality Comments: No specialty comments available.  Procedures:  No procedures performed Allergies: Patient has no known allergies.   Assessment / Plan:     Visit Diagnoses: Positive ANA (antinuclear antibody) - ANA 1:80 NH, ENA negative, complements normal.  She has no clinical features of autoimmune disease.  She has no clinical features of autoimmune disease.  She is very much concerned about the history  of positive ANA and would like to have repeat labs.  I will obtain AVISE labs.- Plan: Sedimentation rate  Polyarthralgia-she complains of pain and discomfort in multiple joints.  She states she has pain in the entire thoracic spine.  She also has discomfort in her right TMJ, right epicondyle region in both hands.  Primary osteoarthritis of both hands-clinical findings are consistent with osteoarthritis.  DDD (degenerative disc disease), cervical-she has pain and stiffness in her cervical spine with limited range of motion.  Pain in thoracic spine-she has chronic discomfort in the thoracic region which persist.  DDD (degenerative disc disease), lumbar - Lumbar spine discectomy April 2021 by Dr. Venetia Constable.  She continues to have discomfort in her lumbar region.  Chronic SI joint pain-she has chronic discomfort in her SI joints.  Other fatigue-she continues to have fatigue.  Elevated LFTs -I noticed her labs from April which showed elevated LFTs.  She believes it was because she was taking multiple medications for pain relief.  We will repeat LFTs today.  Plan: COMPLETE METABOLIC PANEL WITH GFR  Rash-she has noticed some rash on her lower extremities.  It  appears to be Schamberg's purpura.  I will obtain vasculitis panel.  Postmenopausal -patient states she has never had a bone density. - Plan: DG BONE DENSITY (DXA)  Tinnitus of both ears -evaluated by ENT.  Paresthesia of skin - I have advised her if her symptoms persist she may follow-up with Dr. Hardin Negus.  History of depression-she states her depression is well controlled.  History of gastroesophageal reflux (GERD)  Orders: Orders Placed This Encounter  Procedures  . DG BONE DENSITY (DXA)  . COMPLETE METABOLIC PANEL WITH GFR  . Sedimentation rate   No orders of the defined types were placed in this encounter.     Follow-Up Instructions: Return in about 2 months (around 11/24/2019) for Osteoarthritis, DDD.   Bo Merino, MD  Note - This record has been created using Editor, commissioning.  Chart creation errors have been sought, but may not always  have been located. Such creation errors do not reflect on  the standard of medical care.

## 2019-09-24 ENCOUNTER — Encounter: Payer: Self-pay | Admitting: Rheumatology

## 2019-09-24 ENCOUNTER — Other Ambulatory Visit: Payer: Self-pay

## 2019-09-24 ENCOUNTER — Ambulatory Visit (INDEPENDENT_AMBULATORY_CARE_PROVIDER_SITE_OTHER): Payer: 59 | Admitting: Rheumatology

## 2019-09-24 VITALS — BP 102/67 | HR 69 | Resp 13 | Ht 67.0 in | Wt 126.2 lb

## 2019-09-24 DIAGNOSIS — Z780 Asymptomatic menopausal state: Secondary | ICD-10-CM

## 2019-09-24 DIAGNOSIS — G8929 Other chronic pain: Secondary | ICD-10-CM

## 2019-09-24 DIAGNOSIS — M5030 Other cervical disc degeneration, unspecified cervical region: Secondary | ICD-10-CM

## 2019-09-24 DIAGNOSIS — M51369 Other intervertebral disc degeneration, lumbar region without mention of lumbar back pain or lower extremity pain: Secondary | ICD-10-CM

## 2019-09-24 DIAGNOSIS — M546 Pain in thoracic spine: Secondary | ICD-10-CM

## 2019-09-24 DIAGNOSIS — R21 Rash and other nonspecific skin eruption: Secondary | ICD-10-CM

## 2019-09-24 DIAGNOSIS — M19042 Primary osteoarthritis, left hand: Secondary | ICD-10-CM

## 2019-09-24 DIAGNOSIS — Z8659 Personal history of other mental and behavioral disorders: Secondary | ICD-10-CM

## 2019-09-24 DIAGNOSIS — R202 Paresthesia of skin: Secondary | ICD-10-CM

## 2019-09-24 DIAGNOSIS — H9313 Tinnitus, bilateral: Secondary | ICD-10-CM

## 2019-09-24 DIAGNOSIS — Z8719 Personal history of other diseases of the digestive system: Secondary | ICD-10-CM

## 2019-09-24 DIAGNOSIS — M2550 Pain in unspecified joint: Secondary | ICD-10-CM

## 2019-09-24 DIAGNOSIS — M19041 Primary osteoarthritis, right hand: Secondary | ICD-10-CM | POA: Diagnosis not present

## 2019-09-24 DIAGNOSIS — M5136 Other intervertebral disc degeneration, lumbar region: Secondary | ICD-10-CM

## 2019-09-24 DIAGNOSIS — R5383 Other fatigue: Secondary | ICD-10-CM

## 2019-09-24 DIAGNOSIS — R7989 Other specified abnormal findings of blood chemistry: Secondary | ICD-10-CM

## 2019-09-24 DIAGNOSIS — R768 Other specified abnormal immunological findings in serum: Secondary | ICD-10-CM | POA: Diagnosis not present

## 2019-09-24 DIAGNOSIS — M7061 Trochanteric bursitis, right hip: Secondary | ICD-10-CM

## 2019-09-24 DIAGNOSIS — M533 Sacrococcygeal disorders, not elsewhere classified: Secondary | ICD-10-CM

## 2019-09-24 DIAGNOSIS — R7689 Other specified abnormal immunological findings in serum: Secondary | ICD-10-CM

## 2019-09-25 LAB — COMPLETE METABOLIC PANEL WITH GFR
AG Ratio: 2.1 (calc) (ref 1.0–2.5)
ALT: 18 U/L (ref 6–29)
AST: 25 U/L (ref 10–35)
Albumin: 4.5 g/dL (ref 3.6–5.1)
Alkaline phosphatase (APISO): 55 U/L (ref 37–153)
BUN: 11 mg/dL (ref 7–25)
CO2: 29 mmol/L (ref 20–32)
Calcium: 10 mg/dL (ref 8.6–10.4)
Chloride: 104 mmol/L (ref 98–110)
Creat: 0.71 mg/dL (ref 0.50–1.05)
GFR, Est African American: 110 mL/min/{1.73_m2} (ref >=60)
GFR, Est Non African American: 95 mL/min/{1.73_m2} (ref >=60)
Globulin: 2.1 g/dL (calc) (ref 1.9–3.7)
Glucose, Bld: 91 mg/dL (ref 65–99)
Potassium: 4.2 mmol/L (ref 3.5–5.3)
Sodium: 140 mmol/L (ref 135–146)
Total Bilirubin: 0.7 mg/dL (ref 0.2–1.2)
Total Protein: 6.6 g/dL (ref 6.1–8.1)

## 2019-09-25 LAB — SEDIMENTATION RATE: Sed Rate: 2 mm/h (ref 0–30)

## 2019-09-25 NOTE — Progress Notes (Signed)
Sed rate and CMP are normal.

## 2019-10-06 ENCOUNTER — Encounter: Payer: Self-pay | Admitting: Obstetrics and Gynecology

## 2019-10-27 NOTE — Progress Notes (Signed)
Office Visit Note  Patient: Maureen Davis             Date of Birth: 1960-03-16           MRN: [id number]             PCP: Jonathon Jordan, MD Referring: Jonathon Jordan, MD Visit Date: 11/10/2019 Occupation: @GUAROCC @  Subjective:  Pain  In multiple joints.   History of Present Illness: Maureen Davis is a 59 y.o. female with history of osteoarthritis and degenerative disc disease. She states she continues to have pain and discomfort in her hands, cervical, thoracic and lumbar spine. She states that she had lumbar spine surgery her neuropathy has gotten worse. She has been having increased tingling and numbness in her lower extremities and also the paresthesias sometimes climb all the way to her scalp.  Activities of Daily Living:  Patient reports morning stiffness for 2 hours.   Patient Reports nocturnal pain.  Difficulty dressing/grooming: Denies Difficulty climbing stairs: Denies Difficulty getting out of chair: Denies Difficulty using hands for taps, buttons, cutlery, and/or writing: Denies  Review of Systems  Constitutional: Negative for fatigue.  HENT: Negative for mouth sores, mouth dryness and nose dryness.   Eyes: Negative for pain, itching, visual disturbance and dryness.  Respiratory: Negative for cough, hemoptysis, shortness of breath and difficulty breathing.   Cardiovascular: Positive for swelling in legs/feet. Negative for chest pain and palpitations.  Gastrointestinal: Negative for abdominal pain, blood in stool, constipation and diarrhea.  Endocrine: Positive for increased urination.       Patient is being evaluated by PCP for possible UTI  Genitourinary: Negative for painful urination.  Musculoskeletal: Positive for arthralgias, joint pain, myalgias, morning stiffness and myalgias. Negative for joint swelling, muscle weakness and muscle tenderness.  Skin: Negative for color change, rash and redness.  Allergic/Immunologic: Negative for susceptible to  infections.  Neurological: Negative for dizziness, headaches and weakness.  Hematological: Negative for swollen glands.  Psychiatric/Behavioral: Positive for sleep disturbance. Negative for confusion.    PMFS History:  Patient Active Problem List   Diagnosis Date Noted  . Primary osteoarthritis of both hands 11/10/2019  . DDD (degenerative disc disease), lumbar 11/10/2019  . Lumbar radiculopathy 04/30/2019  . DDD (degenerative disc disease), cervical 08/07/2017  . Coccygodynia 08/07/2017  . Hx of migraines 07/18/2017  . History of gastroesophageal reflux (GERD) 07/18/2017  . History of depression 07/18/2017  . Recurrent UTI 07/18/2017  . Leukopenia 07/18/2017    Past Medical History:  Diagnosis Date  . Arthritis    osteo  . Chronic UTI   . Depression   . Frequent UTI    --sees Dr.McDiarmid  . Headache    migraines went away with ment  . Hypertrophy of uterus   . Leukopenia 2016   WBC 3.8  . Polyp of corpus uteri   . Uterine prolapse     Family History  Problem Relation Age of Onset  . Neuropathy Mother   . Charcot-Marie-Tooth disease Mother   . Hypertension Father   . Osteoarthritis Father   . Thyroid disease Sister        removed   . Healthy Son   . Healthy Son   . Healthy Son    Past Surgical History:  Procedure Laterality Date  . CHOLECYSTECTOMY  1998  . COLONOSCOPY    . CYSTOSCOPY    . LUMBAR LAMINECTOMY/ DECOMPRESSION WITH MET-RX Left 05/01/2019   Procedure: LUMBAR FOUR-FIVE LAMINECTOMY/ DECOMPRESSION WITH MET-RX;  Surgeon: Zada Finders,  Clovis Pu, MD;  Location: [facility] OR;  Service: Neurosurgery;  Laterality: Left;  . TUBAL LIGATION  1994   Social History   Social History Narrative  . Not on file   Immunization History  Administered Date(s) Administered  . PFIZER SARS-COV-2 Vaccination 06/07/2019, 06/28/2019     Objective: Vital Signs: BP (!) 88/57 (BP Location: Left Arm, Patient Position: Sitting, Cuff Size: Small)   Pulse 73   Ht 5\' 7"  (1.702 m)    Wt 129 lb 3.2 oz (58.6 kg)   LMP 10/20/2014 (Exact Date)   BMI 20.24 kg/m    Physical Exam Vitals and nursing note reviewed.  Constitutional:      Appearance: She is well-developed.  HENT:     Head: Normocephalic and atraumatic.  Eyes:     Conjunctiva/sclera: Conjunctivae normal.  Cardiovascular:     Rate and Rhythm: Normal rate and regular rhythm.     Heart sounds: Normal heart sounds.  Pulmonary:     Effort: Pulmonary effort is normal.     Breath sounds: Normal breath sounds.  Abdominal:     General: Bowel sounds are normal.     Palpations: Abdomen is soft.  Musculoskeletal:     Cervical back: Normal range of motion.  Lymphadenopathy:     Cervical: No cervical adenopathy.  Skin:    General: Skin is warm and dry.     Capillary Refill: Capillary refill takes less than 2 seconds.  Neurological:     Mental Status: She is alert and oriented to person, place, and time.  Psychiatric:        Behavior: Behavior normal.      Musculoskeletal Exam: C-spine was in good range of motion. She has discomfort range of motion for lumbar spine. Shoulder joints, elbow joints, wrist joints with good range of motion. She has bilateral PIP and DIP thickening with no synovitis. Hip joints, knee joints, ankles, MTPs and PIPs with good range of motion with no synovitis.  CDAI Exam: CDAI Score: -- Patient Global: --; Provider Global: -- Swollen: --; Tender: -- Joint Exam 11/10/2019   No joint exam has been documented for this visit   There is currently no information documented on the homunculus. Go to the Rheumatology activity and complete the homunculus joint exam.  Investigation: No additional findings.  Imaging: No results found.  Recent Labs: Lab Results  Component Value Date   WBC 5.6 05/01/2019   HGB 13.0 05/01/2019   PLT 200 05/01/2019   NA 140 09/24/2019   K 4.2 09/24/2019   CL 104 09/24/2019   CO2 29 09/24/2019   GLUCOSE 91 09/24/2019   BUN 11 09/24/2019   CREATININE  0.71 09/24/2019   BILITOT 0.7 09/24/2019   ALKPHOS 56 05/01/2019   AST 25 09/24/2019   ALT 18 09/24/2019   PROT 6.6 09/24/2019   ALBUMIN 4.0 05/01/2019   CALCIUM 10.0 09/24/2019   GFRAA 110 09/24/2019   September 13, 2021AVISE lupus index -2.4, ANA 1: 80 homogeneous, antihistone weak positive, ENA negative, CB CAP negative, anticardiolipin negative, beta-2 negative, antiphosphatidylserine negative, RF negative, anti-CCP negative, anticarpi negative, anti-TPO negative antithyroglobulin negative, anti-MPO negative, anti-PR-3 negative, anti-GBM negative  Speciality Comments: No specialty comments available.  Procedures:  No procedures performed Allergies: Patient has no known allergies.   Assessment / Plan:     Visit Diagnoses: Positive ANA (antinuclear antibody) - ANA 1:80 NH, ENA negative, complements normal.  She has no clinical features of autoimmune disease. I detailed discussion regarding AIVSE labs with  the patient.  Primary osteoarthritis of both hands - clinical findings are consistent with osteoarthritis.  DDD (degenerative disc disease), cervical-she was given a handout on cervical spine exercises at the last visit. Which was encouraged.  Pain in thoracic spine-thoracic and lumbar spine exercises were demonstrated and discussed today.  DDD (degenerative disc disease), lumbar - Lumbar spine discectomy April 2021 by Dr. Venetia Constable.  Chronic SI joint pain-she has chronic discomfort.  Other fatigue-she continues to have some discomfort as she does have generalized pain.  Postmenopausal - DEXA order in place. She has not scheduled a bone density yet. She has been encouraged to get a DEXA scan. We will discuss that at the follow-up visit.  Rash - she has noticed some rash on her lower extremities.  It appears to be Schamberg's purpura. Pan-ANCA was negative.  Paresthesia of skin-I am uncertain about the etiology. I offered neurology referral but she declined. She is on Celexa,  gabapentin and Soma currently.  History of gastroesophageal reflux (GERD)  History of depression  Tinnitus of both ears - evaluated by ENT.  Educated about COVID-19 virus infection-she is fully vaccinated with COVID-19. Have encouraged her to get booster. She is uncertain about getting the booster at this point. Risk of not getting immunization was discussed. Use of mask, social distancing and hand hygiene was discussed. I discussed the use of monoclonal antibody infusion in case she develops COVID-19 infection.  Orders: No orders of the defined types were placed in this encounter.  No orders of the defined types were placed in this encounter.   Follow-Up Instructions: Return in about 6 months (around 05/10/2020), or if symptoms worsen or fail to improve, for Osteoarthritis, DDD.   Bo Merino, MD  Note - This record has been created using Editor, commissioning.  Chart creation errors have been sought, but may not always  have been located. Such creation errors do not reflect on  the standard of medical care.

## 2019-11-10 ENCOUNTER — Ambulatory Visit (INDEPENDENT_AMBULATORY_CARE_PROVIDER_SITE_OTHER): Payer: 59 | Admitting: Rheumatology

## 2019-11-10 ENCOUNTER — Encounter: Payer: Self-pay | Admitting: Rheumatology

## 2019-11-10 ENCOUNTER — Other Ambulatory Visit: Payer: Self-pay

## 2019-11-10 VITALS — BP 88/57 | HR 73 | Ht 67.0 in | Wt 129.2 lb

## 2019-11-10 DIAGNOSIS — M5030 Other cervical disc degeneration, unspecified cervical region: Secondary | ICD-10-CM

## 2019-11-10 DIAGNOSIS — R5383 Other fatigue: Secondary | ICD-10-CM

## 2019-11-10 DIAGNOSIS — M19041 Primary osteoarthritis, right hand: Secondary | ICD-10-CM | POA: Diagnosis not present

## 2019-11-10 DIAGNOSIS — M533 Sacrococcygeal disorders, not elsewhere classified: Secondary | ICD-10-CM

## 2019-11-10 DIAGNOSIS — M19042 Primary osteoarthritis, left hand: Secondary | ICD-10-CM

## 2019-11-10 DIAGNOSIS — M546 Pain in thoracic spine: Secondary | ICD-10-CM | POA: Diagnosis not present

## 2019-11-10 DIAGNOSIS — Z8659 Personal history of other mental and behavioral disorders: Secondary | ICD-10-CM

## 2019-11-10 DIAGNOSIS — R768 Other specified abnormal immunological findings in serum: Secondary | ICD-10-CM

## 2019-11-10 DIAGNOSIS — R202 Paresthesia of skin: Secondary | ICD-10-CM

## 2019-11-10 DIAGNOSIS — M5136 Other intervertebral disc degeneration, lumbar region: Secondary | ICD-10-CM | POA: Insufficient documentation

## 2019-11-10 DIAGNOSIS — H9313 Tinnitus, bilateral: Secondary | ICD-10-CM

## 2019-11-10 DIAGNOSIS — R7689 Other specified abnormal immunological findings in serum: Secondary | ICD-10-CM

## 2019-11-10 DIAGNOSIS — R21 Rash and other nonspecific skin eruption: Secondary | ICD-10-CM

## 2019-11-10 DIAGNOSIS — Z780 Asymptomatic menopausal state: Secondary | ICD-10-CM

## 2019-11-10 DIAGNOSIS — M51369 Other intervertebral disc degeneration, lumbar region without mention of lumbar back pain or lower extremity pain: Secondary | ICD-10-CM

## 2019-11-10 DIAGNOSIS — G8929 Other chronic pain: Secondary | ICD-10-CM

## 2019-11-10 DIAGNOSIS — Z7189 Other specified counseling: Secondary | ICD-10-CM

## 2019-11-10 DIAGNOSIS — Z8719 Personal history of other diseases of the digestive system: Secondary | ICD-10-CM

## 2019-11-30 ENCOUNTER — Telehealth: Payer: Self-pay | Admitting: *Deleted

## 2019-11-30 NOTE — Telephone Encounter (Signed)
Received DEXA results from [facility].  Date of Scan: 11/25/2019 Lowest T-score and site measured: -2.7 Right Femoral Neck Significant changes in BMD and site measured (5% and above): n/a  Current Regimen: Patient not on treatment  Recommendation: Please schedule an appointment to discuss DEXA results and treatment options.

## 2019-12-01 NOTE — Telephone Encounter (Signed)
Attempted to contact the patient and left message for patient to call the office.  

## 2019-12-21 NOTE — Telephone Encounter (Signed)
Patient returned call to the office and has been scheduled to discuss bone density scan results. Patient has been scheduled for 12/31/2019.

## 2019-12-21 NOTE — Progress Notes (Signed)
 Office Visit Note  Patient: Maureen Davis             Date of Birth: 08/11/1960           MRN: [id number]             PCP: Wolters, Sharon, MD Referring: Wolters, Sharon, MD Visit Date: 12/31/2019 Occupation: @GUAROCC@  Subjective:  Discuss DEXA results   History of Present Illness: Maureen Davis is a 59 y.o. female with history of positive ANA, osteoarthritis, and DDD.  Patient presents today to discuss DEXA results and treatment options.  Patient reports that she occasionally takes a calcium supplement but tries to get calcium from her diet.  She is not currently taking a vitamin D supplement.  She denies any recent falls or fractures.  She continues to have chronic neck and lower back pain.  She was evaluated by her neurosurgeon yesterday and was referred to Dr. Ahern to evaluate the neuralgias she has been experiencing pain.  She denies any other joint pain or joint swelling at this time.    Activities of Daily Living:  Patient reports morning stiffness for 1   hour.   Patient Reports nocturnal pain.  Difficulty dressing/grooming: Denies Difficulty climbing stairs: Denies Difficulty getting out of chair: Denies Difficulty using hands for taps, buttons, cutlery, and/or writing: Denies  Review of Systems  Constitutional: Positive for fatigue.  HENT: Negative for mouth sores, mouth dryness and nose dryness.   Eyes: Negative for pain, visual disturbance and dryness.  Respiratory: Negative for cough, hemoptysis, shortness of breath and difficulty breathing.   Cardiovascular: Negative for chest pain, palpitations, hypertension and swelling in legs/feet.  Gastrointestinal: Negative for blood in stool, constipation and diarrhea.  Endocrine: Negative for increased urination.  Genitourinary: Negative for painful urination.  Musculoskeletal: Positive for arthralgias, joint pain, joint swelling, myalgias, muscle weakness, morning stiffness, muscle tenderness and myalgias.  Skin:  Negative for color change, pallor, rash, hair loss, nodules/bumps, skin tightness, ulcers and sensitivity to sunlight.  Allergic/Immunologic: Negative for susceptible to infections.  Neurological: Positive for numbness and parasthesias. Negative for dizziness, headaches and weakness.  Hematological: Negative for swollen glands.  Psychiatric/Behavioral: Positive for sleep disturbance. Negative for depressed mood. The patient is not nervous/anxious.     PMFS History:  Patient Active Problem List   Diagnosis Date Noted  . Primary osteoarthritis of both hands 11/10/2019  . DDD (degenerative disc disease), lumbar 11/10/2019  . Lumbar radiculopathy 04/30/2019  . DDD (degenerative disc disease), cervical 08/07/2017  . Coccygodynia 08/07/2017  . Hx of migraines 07/18/2017  . History of gastroesophageal reflux (GERD) 07/18/2017  . History of depression 07/18/2017  . Recurrent UTI 07/18/2017  . Leukopenia 07/18/2017    Past Medical History:  Diagnosis Date  . Arthritis    osteo  . Chronic UTI   . Depression   . Frequent UTI    --sees Dr.McDiarmid  . Headache    migraines went away with ment  . Hypertrophy of uterus   . Leukopenia 2016   WBC 3.8  . Polyp of corpus uteri   . Uterine prolapse     Family History  Problem Relation Age of Onset  . Neuropathy Mother   . Charcot-Marie-Tooth disease Mother   . Hypertension Father   . Osteoarthritis Father   . Thyroid disease Sister        removed   . Healthy Son   . Healthy Son   . Healthy Son    Past Surgical   History:  Procedure Laterality Date  . CHOLECYSTECTOMY  1998  . COLONOSCOPY    . CYSTOSCOPY    . LUMBAR LAMINECTOMY/ DECOMPRESSION WITH MET-RX Left 05/01/2019   Procedure: LUMBAR FOUR-FIVE LAMINECTOMY/ DECOMPRESSION WITH MET-RX;  Surgeon: Ostergard, Thomas A, MD;  Location: [facility] OR;  Service: Neurosurgery;  Laterality: Left;  . TUBAL LIGATION  1994   Social History   Social History Narrative  . Not on file   Immunization  History  Administered Date(s) Administered  . PFIZER SARS-COV-2 Vaccination 06/07/2019, 06/28/2019     Objective: Vital Signs: BP 110/70 (BP Location: Left Arm, Patient Position: Sitting, Cuff Size: Normal)   Pulse 71   Resp 12   Ht 5' 7" (1.702 m)   Wt 131 lb (59.4 kg)   LMP 10/20/2014 (Exact Date)   BMI 20.52 kg/m    Physical Exam Vitals and nursing note reviewed.  Constitutional:      Appearance: She is well-developed.  HENT:     Head: Normocephalic and atraumatic.  Eyes:     Conjunctiva/sclera: Conjunctivae normal.  Pulmonary:     Effort: Pulmonary effort is normal.  Abdominal:     Palpations: Abdomen is soft.  Musculoskeletal:     Cervical back: Normal range of motion.  Skin:    General: Skin is warm and dry.     Capillary Refill: Capillary refill takes less than 2 seconds.  Neurological:     Mental Status: She is alert and oriented to person, place, and time.  Psychiatric:        Behavior: Behavior normal.      Musculoskeletal Exam: C-spine has good range of motion with no discomfort.  Some discomfort with lumbar range of motion.  Shoulder joints, lesions, wrist joints, MCPs, PIPs, DIPs have good range of motion with no synovitis.  She has PIP and DIP thickening consistent with osteoarthritis of both hands.  Hip joints have good range of motion with no discomfort.  Knee joints have good range of motion with no warmth or effusion.  Ankle joints have good range of motion with no tenderness or inflammation.  CDAI Exam: CDAI Score: -- Patient Global: --; Provider Global: -- Swollen: --; Tender: -- Joint Exam 12/31/2019   No joint exam has been documented for this visit   There is currently no information documented on the homunculus. Go to the Rheumatology activity and complete the homunculus joint exam.  Investigation: No additional findings.  Imaging: No results found.  Recent Labs: Lab Results  Component Value Date   WBC 5.6 05/01/2019   HGB 13.0  05/01/2019   PLT 200 05/01/2019   NA 140 09/24/2019   K 4.2 09/24/2019   CL 104 09/24/2019   CO2 29 09/24/2019   GLUCOSE 91 09/24/2019   BUN 11 09/24/2019   CREATININE 0.71 09/24/2019   BILITOT 0.7 09/24/2019   ALKPHOS 56 05/01/2019   AST 25 09/24/2019   ALT 18 09/24/2019   PROT 6.6 09/24/2019   ALBUMIN 4.0 05/01/2019   CALCIUM 10.0 09/24/2019   GFRAA 110 09/24/2019    Speciality Comments: No specialty comments available.  Procedures:  No procedures performed Allergies: Patient has no known allergies.  September 13, 2021AVISE lupus index -2.4, ANA 1: 80 homogeneous, antihistone weak positive, ENA negative, CB CAP negative, anticardiolipin negative, beta-2 negative, antiphosphatidylserine negative, RF negative, anti-CCP negative, anticarpi negative, anti-TPO negative antithyroglobulin negative, anti-MPO negative, anti-PR-3 negative, anti-GBM negative Assessment / Plan:     Visit Diagnoses: Positive ANA (antinuclear antibody) -10/12/19: AVISE index -  2/4: 1:80H, anti-histone weak positive, ENA negative:  She has no clinical features of systemic lupus.  She does not require immunosuppression at this time.  She was advised to notify us if she develops any new or worsening symptoms.    Primary osteoarthritis of both hands: RF negative, Anti-CCP negative: She has PIP and DIP thickening consistent with osteoarthritis of both hands.  No tenderness or inflammation was noted on exam.  She is able to make a complete fist bilaterally.  DDD (degenerative disc disease), cervical: She has good range of motion of the C-spine on exam.  She has been experiencing increased pain and stiffness in her neck.  She followed up with her neurosurgeon yesterday and will be referred to a neurologist for further evaluation of the neuralgias she has been experiencing.  DDD (degenerative disc disease), lumbar - Lumbar spine discectomy April 2021 by Dr. Ostergaard.  She was evaluated by Dr. Ostergaard yesterday.  She  is being referred to Dr. Ahern for evaluation of the generalized neuralgias she has been experiencing.   Chronic SI joint pain: Chronic pain   Other fatigue: Stable  Age-related osteoporosis without current pathological fracture - DEXA 11/25/2019 right femoral neck BMD 0.554 with T score -2.7.  No comparisons available. She has not had any recent falls or fractures. She does have a family history of osteoporosis (mother). Patient is nave to treatment.  She presents today to discuss DEXA results and treatment options.  Indications, contraindications, potential side effects of Fosamax were discussed today in detail.  Instructions on the dosing were also provided.  She will be starting on Fosamax 70 mg 1 tablet by mouth once weekly pending lab results.  We will obtain the following lab work today as a baseline.  She will be due for repeat DEXA in October 2023.    - Plan: VITAMIN D 25 Hydroxy (Vit-D Deficiency, Fractures), COMPLETE METABOLIC PANEL WITH GFR, TSH, Parathyroid hormone, intact (no Ca), Serum protein electrophoresis with reflex  Postmenopausal - Vitamin D level will be checked today. Plan: VITAMIN D 25 Hydroxy (Vit-D Deficiency, Fractures)  History of gastroesophageal reflux (GERD): Symptoms are not currently active.  She is not taking a PPI or H2 blocker at this time.   Instructions on how to take fosamax were provided.   Vitamin D deficiency -She has a history of vitamin D deficiency.  We will recheck her vitamin D level today.  Plan: VITAMIN D 25 Hydroxy (Vit-D Deficiency, Fractures)  Orders: Orders Placed This Encounter  Procedures  . VITAMIN D 25 Hydroxy (Vit-D Deficiency, Fractures)  . COMPLETE METABOLIC PANEL WITH GFR  . TSH  . Parathyroid hormone, intact (no Ca)  . Serum protein electrophoresis with reflex   No orders of the defined types were placed in this encounter.    Follow-Up Instructions: Return in 6 months (on 06/30/2020) for Osteoporosis, Osteoarthritis,  DDD.   Taylor M Dale, PA-C  Note - This record has been created using Dragon software.  Chart creation errors have been sought, but may not always  have been located. Such creation errors do not reflect on  the standard of medical care.  

## 2019-12-31 ENCOUNTER — Ambulatory Visit (INDEPENDENT_AMBULATORY_CARE_PROVIDER_SITE_OTHER): Payer: 59 | Admitting: Physician Assistant

## 2019-12-31 ENCOUNTER — Other Ambulatory Visit: Payer: Self-pay

## 2019-12-31 ENCOUNTER — Telehealth: Payer: Self-pay | Admitting: Neurology

## 2019-12-31 ENCOUNTER — Encounter: Payer: Self-pay | Admitting: Physician Assistant

## 2019-12-31 VITALS — BP 110/70 | HR 71 | Resp 12 | Ht 67.0 in | Wt 131.0 lb

## 2019-12-31 DIAGNOSIS — R768 Other specified abnormal immunological findings in serum: Secondary | ICD-10-CM | POA: Diagnosis not present

## 2019-12-31 DIAGNOSIS — M19042 Primary osteoarthritis, left hand: Secondary | ICD-10-CM

## 2019-12-31 DIAGNOSIS — R5383 Other fatigue: Secondary | ICD-10-CM

## 2019-12-31 DIAGNOSIS — M5136 Other intervertebral disc degeneration, lumbar region: Secondary | ICD-10-CM | POA: Diagnosis not present

## 2019-12-31 DIAGNOSIS — M5030 Other cervical disc degeneration, unspecified cervical region: Secondary | ICD-10-CM | POA: Diagnosis not present

## 2019-12-31 DIAGNOSIS — M19041 Primary osteoarthritis, right hand: Secondary | ICD-10-CM

## 2019-12-31 DIAGNOSIS — M810 Age-related osteoporosis without current pathological fracture: Secondary | ICD-10-CM

## 2019-12-31 DIAGNOSIS — Z8719 Personal history of other diseases of the digestive system: Secondary | ICD-10-CM

## 2019-12-31 DIAGNOSIS — M533 Sacrococcygeal disorders, not elsewhere classified: Secondary | ICD-10-CM

## 2019-12-31 DIAGNOSIS — E559 Vitamin D deficiency, unspecified: Secondary | ICD-10-CM

## 2019-12-31 DIAGNOSIS — G8929 Other chronic pain: Secondary | ICD-10-CM

## 2019-12-31 DIAGNOSIS — Z780 Asymptomatic menopausal state: Secondary | ICD-10-CM

## 2019-12-31 NOTE — Telephone Encounter (Signed)
Okay with me for switch. 

## 2019-12-31 NOTE — Telephone Encounter (Signed)
Maureen Davis, I reviewed her chart and I do not think I have anything more to add. She was thoroughly evaluated by Dr. Anne Hahn as well as Rheumatology. In this case I don't think I could add to her care and the referring office may consider sending her to neurology at [facility] for another office to take a look. thanks

## 2019-12-31 NOTE — Telephone Encounter (Signed)
Patient is requesting provider switch. Being referred for numbness/parasthesias as was evaluated by Dr. Anne Hahn in 2019, patient states symptoms are worsening. Please advise if this is acceptable.

## 2020-01-01 ENCOUNTER — Other Ambulatory Visit: Payer: Self-pay | Admitting: *Deleted

## 2020-01-01 DIAGNOSIS — E559 Vitamin D deficiency, unspecified: Secondary | ICD-10-CM

## 2020-01-01 MED ORDER — VITAMIN D (ERGOCALCIFEROL) 1.25 MG (50000 UNIT) PO CAPS: 50000.0000 [IU] | ORAL_CAPSULE | ORAL | 0 refills | Status: DC

## 2020-01-01 NOTE — Telephone Encounter (Signed)
-----   Message from Gearldine Bienenstock, PA-C sent at 01/01/2020  8:38 AM EST ----- Vitamin D is low-28. Please notify the patient and send in vitamin D 50,00 units by mouth once weekly x3 months. Recheck vitamin D in 3 months.   TSH WNL.  CMP WNL.

## 2020-01-01 NOTE — Telephone Encounter (Signed)
Prescription pending lab results. 

## 2020-01-01 NOTE — Progress Notes (Signed)
Vitamin D is low-28. Please notify the patient and send in vitamin D 50,00 units by mouth once weekly x3 months. Recheck vitamin D in 3 months.   TSH WNL.  CMP WNL.

## 2020-01-01 NOTE — Telephone Encounter (Signed)
Patient also asked if a prescription for Fosamax will be sent to the pharmacy as it was discussed at her appointment.

## 2020-01-04 LAB — PROTEIN ELECTROPHORESIS, SERUM, WITH REFLEX
Albumin ELP: 4.3 g/dL (ref 3.8–4.8)
Alpha 1: 0.3 g/dL (ref 0.2–0.3)
Alpha 2: 0.7 g/dL (ref 0.5–0.9)
Beta 2: 0.2 g/dL (ref 0.2–0.5)
Beta Globulin: 0.4 g/dL (ref 0.4–0.6)
Gamma Globulin: 0.8 g/dL (ref 0.8–1.7)
Total Protein: 6.7 g/dL (ref 6.1–8.1)

## 2020-01-04 LAB — COMPLETE METABOLIC PANEL WITH GFR
AG Ratio: 2.3 (calc) (ref 1.0–2.5)
ALT: 14 U/L (ref 6–29)
AST: 23 U/L (ref 10–35)
Albumin: 4.5 g/dL (ref 3.6–5.1)
Alkaline phosphatase (APISO): 56 U/L (ref 37–153)
BUN: 13 mg/dL (ref 7–25)
CO2: 25 mmol/L (ref 20–32)
Calcium: 9.5 mg/dL (ref 8.6–10.4)
Chloride: 105 mmol/L (ref 98–110)
Creat: 0.67 mg/dL (ref 0.50–1.05)
GFR, Est African American: 112 mL/min/{1.73_m2} (ref >=60)
GFR, Est Non African American: 97 mL/min/{1.73_m2} (ref >=60)
Globulin: 2 g/dL (calc) (ref 1.9–3.7)
Glucose, Bld: 92 mg/dL (ref 65–139)
Potassium: 4.4 mmol/L (ref 3.5–5.3)
Sodium: 142 mmol/L (ref 135–146)
Total Bilirubin: 0.6 mg/dL (ref 0.2–1.2)
Total Protein: 6.5 g/dL (ref 6.1–8.1)

## 2020-01-04 LAB — PARATHYROID HORMONE, INTACT (NO CA): PTH: 25 pg/mL (ref 14–64)

## 2020-01-04 LAB — VITAMIN D 25 HYDROXY (VIT D DEFICIENCY, FRACTURES): Vit D, 25-Hydroxy: 28 ng/mL — ABNORMAL LOW (ref 30–100)

## 2020-01-04 LAB — TSH: TSH: 0.51 mIU/L (ref 0.40–4.50)

## 2020-01-05 ENCOUNTER — Other Ambulatory Visit: Payer: Self-pay | Admitting: *Deleted

## 2020-01-05 MED ORDER — ALENDRONATE SODIUM 70 MG PO TABS: 70.0000 mg | ORAL_TABLET | ORAL | 0 refills | Status: DC

## 2020-01-05 NOTE — Progress Notes (Signed)
PTH WNL.  SPEP normal.  Ok to start on fosamax 70 mg 1 tablet by mouth once weekly.

## 2020-01-05 NOTE — Telephone Encounter (Signed)
-----   Message from Gearldine Bienenstock, PA-C sent at 01/05/2020  8:07 AM EST ----- PTH WNL.  SPEP normal.  Ok to start on fosamax 70 mg 1 tablet by mouth once weekly.

## 2020-01-28 ENCOUNTER — Other Ambulatory Visit: Payer: Self-pay | Admitting: Physician Assistant

## 2020-01-28 DIAGNOSIS — E559 Vitamin D deficiency, unspecified: Secondary | ICD-10-CM

## 2020-03-28 ENCOUNTER — Ambulatory Visit: Payer: 59 | Admitting: Obstetrics and Gynecology

## 2020-03-31 ENCOUNTER — Ambulatory Visit: Payer: 59 | Admitting: Obstetrics and Gynecology

## 2020-04-15 ENCOUNTER — Encounter: Payer: Self-pay | Admitting: Neurology

## 2020-04-15 ENCOUNTER — Other Ambulatory Visit: Payer: Self-pay

## 2020-04-15 ENCOUNTER — Ambulatory Visit (INDEPENDENT_AMBULATORY_CARE_PROVIDER_SITE_OTHER): Payer: 59 | Admitting: Neurology

## 2020-04-15 VITALS — BP 104/64 | HR 69 | Ht 67.0 in | Wt 130.0 lb

## 2020-04-15 DIAGNOSIS — R202 Paresthesia of skin: Secondary | ICD-10-CM

## 2020-04-15 DIAGNOSIS — M5416 Radiculopathy, lumbar region: Secondary | ICD-10-CM | POA: Diagnosis not present

## 2020-04-15 MED ORDER — GABAPENTIN 100 MG PO CAPS: 200.0000 mg | ORAL_CAPSULE | Freq: Every day | ORAL | 3 refills | Status: DC

## 2020-04-15 NOTE — Progress Notes (Signed)
Reason for visit: Paresthesias in the legs  Referring physician: Dr. Jettie Davis is a 60 y.o. female  History of present illness:  Maureen Davis is a 60 year old right-handed white female with a history of depression and intermittent total body paresthesias.  She is followed through Dr. Estanislado Davis from rheumatology for migratory arthralgias, positive ANA, and leukopenia.  She is not felt to have lupus.  The patient has had 2 lumbar spine surgeries done in April 2021, she had significant neuroforaminal stenosis at the L4-5 level on the left with left-sided sciatica that was quite severe.  Following the initial surgery she had recurrence of symptoms and had to have another procedure done.  She has had persistence of significant discomfort in the left leg and had MRI of the lumbar spine done in June 2021 which did show some enhancing granulation tissue on the left around the site of surgery.  The patient indicates that about 3 to 4 months following surgery she sneezed and since that time has noted some symptoms down the right leg as well.  The patient has some numbness that is present in the feet, particularly with the big toe on the right and some sensation of weakness of the little toe on the left foot and a squeezing sensation just above the ankle on the left with some numbness into the left foot.  The patient has some crawling sensations across the low back and some discomfort down the left leg.  She denies any significant issues with balance and she has not had any falls.  She does have some occasional urinary urgency and incontinence.  She continues to have episodes of total body numbness that starts in the head and works throughout the body and arms and legs that lasts about 2 minutes and clears, she was seen for this issue in 2019.  She underwent MRI of the brain on 22 January 2020 after she had some problems with slurred speech and slight confusion, she underwent a TIA work-up.  MRI  of the brain was normal, CT angiogram of the head and neck was normal.  The patient does have a history of migraine headaches, she claims this is not a significant issue for her.  She has undergone physical therapy over 8 months without significant improvement.  She is concerned that her symptoms down both legs are worsening and for this reason she was referred to this office for further evaluation.  Past Medical History:  Diagnosis Date  . Arthritis    osteo  . Chronic UTI   . Depression   . Frequent UTI    --sees Dr.McDiarmid  . Headache    migraines went away with ment  . Hypertrophy of uterus   . Leukopenia 2016   WBC 3.8  . Polyp of corpus uteri   . Uterine prolapse     Past Surgical History:  Procedure Laterality Date  . CHOLECYSTECTOMY  1998  . COLONOSCOPY    . CYSTOSCOPY    . LUMBAR LAMINECTOMY/ DECOMPRESSION WITH MET-RX Left 05/01/2019   Procedure: LUMBAR FOUR-FIVE LAMINECTOMY/ DECOMPRESSION WITH MET-RX;  Surgeon: Maureen Part, MD;  Location: [facility];  Service: Neurosurgery;  Laterality: Left;  . TUBAL LIGATION  1994    Family History  Problem Relation Age of Onset  . Neuropathy Mother   . Charcot-Marie-Tooth disease Mother   . Hypertension Father   . Osteoarthritis Father   . Thyroid disease Sister        removed   .  Healthy Son   . Healthy Son   . Healthy Son     Social history:  reports that she has never smoked. She has never used smokeless tobacco. She reports current alcohol use of about 2.0 - 3.0 standard drinks of alcohol per week. She reports that she does not use drugs.  Medications:  Prior to Admission medications   Medication Sig Start Date End Date Taking? Authorizing Provider  alendronate (FOSAMAX) 70 MG tablet Take 1 tablet (70 mg total) by mouth once a week. Take with a full glass of water on an empty stomach. 01/05/20   Maureen Neas, PA-C  carisoprodol (SOMA) 350 MG tablet Take 350 mg by mouth as needed for muscle spasms.  05/22/18    [provider]  citalopram (CELEXA) 20 MG tablet Take 10 mg by mouth at bedtime.  12/22/13   [provider]  estradiol (ESTRACE) 0.1 MG/GM vaginal cream Use 1/2 g vaginally every night for the first 2 weeks, then use 1/2 g vaginally two or three times per week as needed to maintain symptom relief. 03/24/19   Maureen Cobbs, MD  gabapentin (NEURONTIN) 100 MG capsule Take 100 mg by mouth as needed.  06/03/19   [provider]  Vitamin D, Ergocalciferol, (DRISDOL) 1.25 MG (50000 UNIT) CAPS capsule Take 1 capsule (50,000 Units total) by mouth every 7 (seven) days. 01/01/20   Maureen Neas, PA-C     No Known Allergies  ROS:  Out of a complete 14 system review of symptoms, the patient complains only of the following symptoms, and all other reviewed systems are negative.  Numbness and weakness in the legs  Last menstrual period 10/20/2014.  Physical Exam  General: The patient is alert and cooperative at the time of the examination.  Eyes: Pupils are equal, round, and reactive to light. Discs are flat bilaterally.  Neck: The neck is supple, no carotid bruits are noted.  Respiratory: The respiratory examination is clear.  Cardiovascular: The cardiovascular examination reveals a regular rate and rhythm, no obvious murmurs or rubs are noted.  Skin: Extremities are without significant edema.  Neurologic Exam  Mental status: The patient is alert and oriented x 3 at the time of the examination. The patient has apparent normal recent and remote memory, with an apparently normal attention span and concentration ability.  Cranial nerves: Facial symmetry is present. There is good sensation of the face to pinprick and soft touch bilaterally. The strength of the facial muscles and the muscles to head turning and shoulder shrug are normal bilaterally. Speech is well enunciated, no aphasia or dysarthria is noted. Extraocular movements are full. Visual fields are  full. The tongue is midline, and the patient has symmetric elevation of the soft palate. No obvious hearing deficits are noted.  Motor: The motor testing reveals 5 over 5 strength of all 4 extremities. Good symmetric motor tone is noted throughout.  Sensory: Sensory testing is intact to pinprick, soft touch, vibration sensation, and position sense on all 4 extremities, with exception of some decreased vibration sensation in both feet, decreased position sense in the left foot. No evidence of extinction is noted.  Coordination: Cerebellar testing reveals good finger-nose-finger and heel-to-shin bilaterally.  Gait and station: Gait is normal. Tandem gait is normal. Romberg is negative. No drift is seen.  The patient is able to walk on the toes bilaterally, she may have some difficulty walking on the heel with the left foot as compared to the  right.  Reflexes: Deep tendon reflexes are symmetric and normal bilaterally.  The ankle jerk reflexes are well-maintained bilaterally.  Toes are downgoing bilaterally.   Assessment/Plan:  1.  Prior lumbosacral spine surgery, foraminotomy on the left at the L4-5 level  2.  Sensory symptoms, bilateral lower extremities  The patient has had what she claims is some worsening of sensory symptoms in both legs that has continued following surgery.  The patient will be set up for nerve conduction studies of both legs and EMG studies on both legs.  The patient may require a repeat MRI of the lumbar spine in the future.  She has undergone physical therapy previously.  She will go up on her gabapentin taking 200 mg at night as she is having problems with sleeping due to discomfort.  She will follow-up otherwise in 4 months.  Jill Alexanders MD 04/15/2020 8:58 AM  [facility] [address] [place], [place] [address]  Phone [phone] Fax [phone]

## 2020-04-15 NOTE — Patient Instructions (Signed)
We will increase the gabapentin to 200 mg at night.  Neurontin (gabapentin) may result in drowsiness, ankle swelling, gait instability, or possibly dizziness. Please contact our office if significant side effects occur with this medication.

## 2020-05-09 ENCOUNTER — Encounter: Payer: 59 | Admitting: Neurology

## 2020-05-17 ENCOUNTER — Ambulatory Visit: Payer: 59 | Admitting: Rheumatology

## 2020-05-24 ENCOUNTER — Encounter: Payer: Self-pay | Admitting: Neurology

## 2020-05-24 ENCOUNTER — Ambulatory Visit (INDEPENDENT_AMBULATORY_CARE_PROVIDER_SITE_OTHER): Payer: 59 | Admitting: Neurology

## 2020-05-24 DIAGNOSIS — M5416 Radiculopathy, lumbar region: Secondary | ICD-10-CM | POA: Diagnosis not present

## 2020-05-24 DIAGNOSIS — R202 Paresthesia of skin: Secondary | ICD-10-CM

## 2020-05-24 NOTE — Progress Notes (Addendum)
EMG nerve conduction study shows evidence of some acute changes in both legs to the S1 level, chronic changes at the L5 level on the left.  Given the changes in her clinical condition, we will repeat MRI of the lumbar spine with and without gadolinium enhancement.   MNC    Nerve / Sites Muscle Latency Ref. Amplitude Ref. Rel Amp Segments Distance Velocity Ref. Area    ms ms mV mV %  cm m/s m/s mVms  R Peroneal - EDB     Ankle EDB 4.3 ?6.5 3.6 ?2.0 100 Ankle - EDB 9   12.1     Fib head EDB 10.9  3.4  94.5 Fib head - Ankle 29 44 ?44 12.0     Pop fossa EDB 13.2  3.3  99.1 Pop fossa - Fib head 10 44 ?44 12.1         Pop fossa - Ankle      L Peroneal - EDB     Ankle EDB 5.8 ?6.5 0.9 ?2.0 100 Ankle - EDB 9   3.4     Fib head EDB 12.4  0.6  68.3 Fib head - Ankle 29 44 ?44 2.2     Pop fossa EDB 14.7  0.6  100 Pop fossa - Fib head 10 44 ?44 2.7         Pop fossa - Ankle      R Tibial - AH     Ankle AH 3.9 ?5.8 7.6 ?4.0 100 Ankle - AH 9   12.0     Pop fossa AH 13.5  6.4  84.2 Pop fossa - Ankle 39 41 ?41 11.4  L Tibial - AH     Ankle AH 4.0 ?5.8 8.2 ?4.0 100 Ankle - AH 9   17.0     Pop fossa AH 13.0  5.0  61 Pop fossa - Ankle 38 42 ?41 16.3             SNC    Nerve / Sites Rec. Site Peak Lat Ref.  Amp Ref. Segments Distance    ms ms V V  cm  R Sural - Ankle (Calf)     Calf Ankle 3.5 ?4.4 10 ?6 Calf - Ankle 14  L Sural - Ankle (Calf)     Calf Ankle 3.3 ?4.4 3 ?6 Calf - Ankle 14  R Superficial peroneal - Ankle     Lat leg Ankle 3.8 ?4.4 2 ?6 Lat leg - Ankle 14  L Superficial peroneal - Ankle     Lat leg Ankle 3.6 ?4.4 2 ?6 Lat leg - Ankle 14             F  Wave    Nerve F Lat Ref.   ms ms  R Tibial - AH 56.0 ?56.0  L Tibial - AH 55.7 ?56.0

## 2020-05-24 NOTE — Procedures (Signed)
     HISTORY: Maureen Davis is a 60 year old patient with a history of prior lumbar spine surgery.  The patient has developed sensory alterations in both feet.  Originally, she had a radiculopathy on the left side only.  She is being evaluated for a possible neuropathy or a lumbosacral radiculopathy.  NERVE CONDUCTION STUDIES:  Nerve conduction studies were performed on both lower extremities. The distal motor latencies and motor amplitudes for the peroneal and posterior tibial nerves were within normal limits, with exception of a slightly low motor amplitude for the left peroneal nerve. The nerve conduction velocities for these nerves were also normal. The sensory latencies for the peroneal and sural nerves were within normal limits. The F wave latencies for the posterior tibial nerves were within normal limits.   EMG STUDIES:  EMG study was performed on the left lower extremity:  The tibialis anterior muscle reveals 2 to 4K motor units with slightly reduced recruitment. No fibrillations or positive waves were seen. The peroneus tertius muscle reveals 2 to 4K motor units with slightly reduced recruitment. No fibrillations or positive waves were seen. The medial gastrocnemius muscle reveals 1 to 3K motor units with full recruitment. 1+ positive waves were seen. The vastus lateralis muscle reveals 2 to 4K motor units with full recruitment. No fibrillations or positive waves were seen. The iliopsoas muscle reveals 2 to 4K motor units with full recruitment. No fibrillations or positive waves were seen. The biceps femoris muscle (long head) reveals 2 to 4K motor units with full recruitment. No fibrillations or positive waves were seen. The lumbosacral paraspinal muscles were tested at 3 levels, and revealed no abnormalities of insertional activity at the upper and middle levels tested.  2+ fibrillations and positive waves were seen at the lower level.  There was good relaxation.  EMG study was  performed on the right lower extremity:  The tibialis anterior muscle reveals 2 to 4K motor units with full recruitment. No fibrillations or positive waves were seen. The peroneus tertius muscle reveals 2 to 4K motor units with slightly reduced recruitment. No fibrillations or positive waves were seen. The medial gastrocnemius muscle reveals 1 to 3K motor units with full recruitment.  1+ fibrillations and positive waves were seen. The vastus lateralis muscle reveals 2 to 4K motor units with full recruitment. No fibrillations or positive waves were seen. The iliopsoas muscle reveals 2 to 4K motor units with full recruitment. No fibrillations or positive waves were seen. The biceps femoris muscle (long head) reveals 2 to 4K motor units with full recruitment. No fibrillations or positive waves were seen. The lumbosacral paraspinal muscles were tested at 3 levels, and revealed no abnormalities of insertional activity at all 3 levels tested. There was good relaxation.   IMPRESSION:  Nerve conduction studies done on both lower extremities were relatively unremarkable, without evidence of a peripheral neuropathy.  EMG evaluation of the left lower extremity showed mild chronic stable signs of an L5 radiculopathy with evidence of mild acute denervation in the S1 nerve root distribution.  EMG evaluation of the right lower extremity showed mild acute and chronic changes in the S1 nerve root distribution.  Marlan Palau MD 05/24/2020 4:55 PM  [facility] [address] [place], [place] [address]  Phone [phone] Fax [phone]

## 2020-05-24 NOTE — Progress Notes (Signed)
Please refer to EMG and nerve conduction procedure note.  

## 2020-05-25 ENCOUNTER — Telehealth: Payer: Self-pay | Admitting: Neurology

## 2020-05-25 NOTE — Telephone Encounter (Signed)
unable to leave vmail  [employer] auth: NPR via [employer]

## 2020-06-01 NOTE — Progress Notes (Signed)
60 y.o. G36P3003 Married Caucasian female here for annual exam.    Patient wants to discuss BMD test results. She has osteoporosis.  Had 2 back surgeries and had a retinal tear.  Mother in law passed away and son and daughter in law are not together.   Is using vaginal estrogen cream.  Received her Covid booster.   PCP:  Jonathon Jordan, MD   Patient's last menstrual period was 10/20/2014 (exact date).           Sexually active: Yes.    The current method of family planning is tubal ligation.    Exercising: No.  adjusted exercises since back surgery Smoker:  no  Health Maintenance: Pap: 01-10-18 Neg:Neg HR HPV, 12-21-14 Neg:Neg HR HPV History of abnormal Pap:  Yes, 1994 hx of cryotherapy to cervix. MMG:  09-22-19 3D/Neg/BiRads1 Colonoscopy:  2016 normal with Dr. Lenise Herald 2026 BMD:  11-25-19  Result Osteoporosis TDaP:  PCP Gardasil:   no TWS:FKCLE Hep C:declines Screening Labs:  Rheumatology.   reports that she has never smoked. She has never used smokeless tobacco. She reports current alcohol use of about 2.0 - 3.0 standard drinks of alcohol per week. She reports that she does not use drugs.  Past Medical History:  Diagnosis Date  . Arthritis    osteo  . Chronic UTI   . Depression   . Frequent UTI    --sees Dr.McDiarmid  . Headache    migraines went away with ment  . Hypertrophy of uterus   . Leukopenia 2016   WBC 3.8  . Osteoporosis   . Polyp of corpus uteri   . Uterine prolapse     Past Surgical History:  Procedure Laterality Date  . CHOLECYSTECTOMY  1998  . COLONOSCOPY    . CYSTOSCOPY    . LUMBAR LAMINECTOMY/ DECOMPRESSION WITH MET-RX Left 05/01/2019   Procedure: LUMBAR FOUR-FIVE LAMINECTOMY/ DECOMPRESSION WITH MET-RX;  Surgeon: [name], MD;  Location: [facility];  Service: Neurosurgery;  Laterality: Left;  . RETINAL TEAR REPAIR CRYOTHERAPY Right 2021  . TUBAL LIGATION  1994    Current Outpatient Medications  Medication Sig Dispense Refill  .  alendronate (FOSAMAX) 70 MG tablet Take 1 tablet (70 mg total) by mouth once a week. Take with a full glass of water on an empty stomach. 12 tablet 0  . carisoprodol (SOMA) 350 MG tablet Take 350 mg by mouth as needed for muscle spasms.     . citalopram (CELEXA) 10 MG tablet Take by mouth.    . estradiol (ESTRACE) 0.1 MG/GM vaginal cream Use 1/2 g vaginally every night for the first 2 weeks, then use 1/2 g vaginally two or three times per week as needed to maintain symptom relief. 42.5 g 2  . gabapentin (NEURONTIN) 100 MG capsule Take 2 capsules (200 mg total) by mouth at bedtime. 60 capsule 3  . Vitamin D, Ergocalciferol, (DRISDOL) 1.25 MG (50000 UNIT) CAPS capsule Take 1 capsule (50,000 Units total) by mouth every 7 (seven) days. 12 capsule 0   No current facility-administered medications for this visit.    Family History  Problem Relation Age of Onset  . Neuropathy Mother   . Charcot-Marie-Tooth disease Mother   . Hypertension Father   . Osteoarthritis Father   . Lupus Father   . Thyroid disease Sister        removed   . Healthy Son   . Healthy Son   . Healthy Son     Review of Systems  All other systems reviewed and are negative.   Exam:   BP 118/62   Pulse 68   Ht 5' 6.5" (1.689 m)   Wt 130 lb (59 kg)   LMP 10/20/2014 (Exact Date)   SpO2 100%   BMI 20.67 kg/m     General appearance: alert, cooperative and appears stated age Head: normocephalic, without obvious abnormality, atraumatic Neck: no adenopathy, supple, symmetrical, trachea midline and thyroid normal to inspection and palpation Lungs: clear to auscultation bilaterally Breasts: normal appearance, no masses or tenderness, No nipple retraction or dimpling, No nipple discharge or bleeding, No axillary adenopathy Heart: regular rate and rhythm Abdomen: soft, non-tender; no masses, no organomegaly Extremities: extremities normal, atraumatic, no cyanosis or edema Skin: skin color, texture, turgor normal. No rashes  or lesions Lymph nodes: cervical, supraclavicular, and axillary nodes normal. Neurologic: grossly normal  Pelvic: External genitalia:  no lesions              No abnormal inguinal nodes palpated.              Urethra:  normal appearing urethra with no masses, tenderness or lesions              Bartholins and Skenes: normal                 Vagina: normal appearing vagina with normal color and discharge, no lesions              Cervix: no lesions              Pap taken: No. Bimanual Exam:  Uterus:  normal size, contour, position, consistency, mobility, non-tender              Adnexa: no mass, fullness, tenderness              Rectal exam: Yes.  .  Confirms.              Anus:  normal sphincter tone, no lesions  Chaperone was present for exam.  Assessment:   Well woman visit with normal exam. Hx UTIs.  Osteoarthritis.  Vaginal atrophy. Osteoporosis.   Plan: Mammogram screening discussed. Self breast awareness reviewed. Pap and HR HPV 2024. Guidelines for Calcium, Vitamin D, regular exercise program including cardiovascular and weight bearing exercise. Refill of vaginal estrogen cream.  I discussed potential effect on breast cancer. BMD reviewed and treatment options for bisphosphonates and Prolia.  She will start her Fosamax prescription. We reviewed Gabapentin as a medication which can treat pain and neuropathy.   She may reach out to her prescriber  and inquire about taking the medication during the day also. Follow up annually and prn.

## 2020-06-02 ENCOUNTER — Telehealth: Payer: Self-pay | Admitting: Neurology

## 2020-06-02 ENCOUNTER — Ambulatory Visit (INDEPENDENT_AMBULATORY_CARE_PROVIDER_SITE_OTHER): Payer: 59 | Admitting: Obstetrics and Gynecology

## 2020-06-02 ENCOUNTER — Other Ambulatory Visit: Payer: Self-pay

## 2020-06-02 ENCOUNTER — Encounter: Payer: Self-pay | Admitting: Obstetrics and Gynecology

## 2020-06-02 VITALS — BP 118/62 | HR 68 | Ht 66.5 in | Wt 130.0 lb

## 2020-06-02 DIAGNOSIS — Z01419 Encounter for gynecological examination (general) (routine) without abnormal findings: Secondary | ICD-10-CM

## 2020-06-02 MED ORDER — ESTRADIOL 0.1 MG/GM VA CREA: TOPICAL_CREAM | VAGINAL | 2 refills | Status: DC

## 2020-06-02 NOTE — Telephone Encounter (Signed)
Patient called back no to the covid questions MR Lumbar spine w/wo contrast Dr. Anne Hahn [facility] Berkley Harvey: NPR via [employer] website. She is scheduled at [facility] for 06/08/20.

## 2020-06-02 NOTE — Telephone Encounter (Signed)
Called to schedule MRI, unable to LVM.

## 2020-06-02 NOTE — Patient Instructions (Signed)

## 2020-06-08 ENCOUNTER — Ambulatory Visit: Payer: 59

## 2020-06-08 ENCOUNTER — Other Ambulatory Visit: Payer: Self-pay

## 2020-06-08 DIAGNOSIS — M5416 Radiculopathy, lumbar region: Secondary | ICD-10-CM | POA: Diagnosis not present

## 2020-06-08 MED ORDER — GADOBENATE DIMEGLUMINE 529 MG/ML IV SOLN
10.0000 mL | Freq: Once | INTRAVENOUS | Status: AC | PRN
  Administered 2020-06-08: 10 mL via INTRAVENOUS

## 2020-06-09 ENCOUNTER — Telehealth: Payer: Self-pay | Admitting: Neurology

## 2020-06-09 DIAGNOSIS — G8929 Other chronic pain: Secondary | ICD-10-CM

## 2020-06-09 NOTE — Telephone Encounter (Signed)
I called the patient.  The MRI of the low back is surprisingly normal.  No evidence of nerve root impingement, no evidence of scar tissue.  The patient is having a lot of hip and back discomfort, I will get her set up for some neuromuscular therapy, she prefers [facility] physical therapy.   MRI lumbar 06/09/20:  IMPRESSION:   Unremarkable MRI lumbar spine (with and without). No spinal stenosis or foraminal narrowing.

## 2020-06-13 NOTE — Telephone Encounter (Signed)
Faxed referral to [facility] PT. Phone: [phone] & Fax: [phone].

## 2020-06-16 NOTE — Progress Notes (Signed)
 Office Visit Note  Patient: Maureen Davis             Date of Birth: 10/02/1960           MRN: [id number]             PCP: Wolters, Sharon, MD Referring: Wolters, Sharon, MD Visit Date: 06/30/2020 Occupation: @GUAROCC@  Subjective:  Other (Discuss osteoporosis medication. Patient experienced jaw side effects with fosamax. )   History of Present Illness: Maureen Davis is a 59 y.o. female with a history of osteoarthritis and osteoporosis.  She states that she took only 1 dose of Fosamax and discontinued as she felt some pain in her jaw.  She had lumbar spine discectomy in April 2021.  She continues to have some lower back pain and muscle spasm in her lower back.  She is some stiffness in her hands due to osteoarthritis.  She denies any joint swelling.  There is no history of oral ulcers, nasal ulcers, malar rash, photosensitivity, raynaud's phenominon.  Activities of Daily Living:  Patient reports morning stiffness for 0 minutes.   Patient Reports nocturnal pain.  Difficulty dressing/grooming: Denies Difficulty climbing stairs: Denies Difficulty getting out of chair: Denies Difficulty using hands for taps, buttons, cutlery, and/or writing: Denies  Review of Systems  Constitutional: Positive for fatigue.  HENT: Negative for mouth sores, mouth dryness and nose dryness.   Eyes: Negative for pain, itching and dryness.  Respiratory: Negative for shortness of breath and difficulty breathing.   Cardiovascular: Negative for chest pain, palpitations and swelling in legs/feet.  Gastrointestinal: Negative for blood in stool, constipation and diarrhea.  Endocrine: Positive for increased urination.  Genitourinary: Negative for difficulty urinating and painful urination.  Musculoskeletal: Positive for arthralgias, joint pain, myalgias and myalgias. Negative for joint swelling, morning stiffness and muscle tenderness.  Skin: Negative for color change, rash and redness.  Allergic/Immunologic:  Positive for susceptible to infections.  Neurological: Positive for numbness and weakness. Negative for dizziness, headaches and memory loss.  Hematological: Negative for bruising/bleeding tendency.  Psychiatric/Behavioral: Negative for confusion.    PMFS History:  Patient Active Problem List   Diagnosis Date Noted  . Primary osteoarthritis of both hands 11/10/2019  . DDD (degenerative disc disease), lumbar 11/10/2019  . Lumbar radiculopathy 04/30/2019  . DDD (degenerative disc disease), cervical 08/07/2017  . Coccygodynia 08/07/2017  . Hx of migraines 07/18/2017  . History of gastroesophageal reflux (GERD) 07/18/2017  . History of depression 07/18/2017  . Recurrent UTI 07/18/2017  . Leukopenia 07/18/2017    Past Medical History:  Diagnosis Date  . Arthritis    osteo  . Chronic UTI   . Depression   . Frequent UTI    --sees Dr.McDiarmid  . Headache    migraines went away with ment  . Hypertrophy of uterus   . Leukopenia 2016   WBC 3.8  . Osteoporosis   . Polyp of corpus uteri   . Uterine prolapse     Family History  Problem Relation Age of Onset  . Neuropathy Mother   . Charcot-Marie-Tooth disease Mother   . Hypertension Father   . Osteoarthritis Father   . Lupus Father   . Thyroid disease Sister        removed   . Healthy Son   . Healthy Son   . Healthy Son    Past Surgical History:  Procedure Laterality Date  . CHOLECYSTECTOMY  1998  . COLONOSCOPY    . CYSTOSCOPY    .   LUMBAR LAMINECTOMY/ DECOMPRESSION WITH MET-RX Left 05/01/2019   Procedure: LUMBAR FOUR-FIVE LAMINECTOMY/ DECOMPRESSION WITH MET-RX;  Surgeon: [name], MD;  Location: [facility];  Service: Neurosurgery;  Laterality: Left;  . RETINAL TEAR REPAIR CRYOTHERAPY Right 2021  . TUBAL LIGATION  1994   Social History   Social History Narrative  . Not on file   Immunization History  Administered Date(s) Administered  . PFIZER(Purple Top)SARS-COV-2 Vaccination 06/07/2019, 06/28/2019      Objective: Vital Signs: BP 111/71 (BP Location: Left Arm, Patient Position: Sitting, Cuff Size: Normal)   Pulse 67   Resp 14   Ht 5' 7" (1.702 m)   Wt 131 lb 12.8 oz (59.8 kg)   LMP 10/20/2014 (Exact Date)   BMI 20.64 kg/m    Physical Exam Vitals and nursing note reviewed.  Constitutional:      Appearance: She is well-developed.  HENT:     Head: Normocephalic and atraumatic.  Eyes:     Conjunctiva/sclera: Conjunctivae normal.  Cardiovascular:     Rate and Rhythm: Normal rate and regular rhythm.     Heart sounds: Normal heart sounds.  Pulmonary:     Effort: Pulmonary effort is normal.     Breath sounds: Normal breath sounds.  Abdominal:     General: Bowel sounds are normal.     Palpations: Abdomen is soft.  Musculoskeletal:     Cervical back: Normal range of motion.  Lymphadenopathy:     Cervical: No cervical adenopathy.  Skin:    General: Skin is warm and dry.     Capillary Refill: Capillary refill takes less than 2 seconds.  Neurological:     Mental Status: She is alert and oriented to person, place, and time.  Psychiatric:        Behavior: Behavior normal.      Musculoskeletal Exam: C-spine was in good range of motion.  She discomfort range of motion of the lumbar spine and some paravertebral muscle tenderness.  Shoulder joints, elbow joints, wrist joints with good range of motion.  She has some PIP and DIP thickening with no synovitis.  Hip joints and knee joints are in good range of motion.  She had no tenderness over ankles or MTPs.   CDAI Exam: CDAI Score: -- Patient Global: --; Provider Global: -- Swollen: --; Tender: -- Joint Exam 06/30/2020   No joint exam has been documented for this visit   There is currently no information documented on the homunculus. Go to the Rheumatology activity and complete the homunculus joint exam.  Investigation: No additional findings.  Imaging: MR Lumbar Spine W Wo Contrast  Result Date: 06/09/2020 [facility] NEUROIMAGING REPORT STUDY DATE: 06/08/20 PATIENT NAME: Maureen Davis DOB: 05/23/1960 MRN: [id number] ORDERING CLINICIAN: Kathrynn Ducking, MD CLINICAL HISTORY: 59 year old female with low back pain. EXAM: MR LUMBAR SPINE W WO CONTRAST TECHNIQUE: MRI of the lumbar spine was obtained utilizing 4 mm sagittal slices from D98-33 down to the lower sacrum with T1, T2 and inversion recovery views. In addition 4 mm axial slices from A2-5 down to L5-S1 level were included with T1 and T2 weighted views. CONTRAST: 32m multihance COMPARISON: none IMAGING SITE: [facility] [address] (1.5 Tesla MRI) FINDINGS: On sagittal views the vertebral bodies have normal height and alignment.  Schmorl's node at L4 superior endplate.  The conus medullaris terminates at the level of L1.  On axial views: L1-2: no spinal stenosis or foraminal narrowing L2-3: no spinal stenosis or foraminal  narrowing L3-4: disc bulging with no spinal stenosis or foraminal narrowing L4-5: mild disc bulging with no spinal stenosis or foraminal narrowing L5-S1: no spinal stenosis or foraminal narrowing Limited views of the aorta, kidneys, iliopsoas muscles and sacroiliac joints are unremarkable. No abnormal enhancing lesions.   Unremarkable MRI lumbar spine (with and without). No spinal stenosis or foraminal narrowing.  INTERPRETING PHYSICIAN: VIKRAM R. PENUMALLI, MD Certified in Neurology, Neurophysiology and Neuroimaging [facility] [address], [address] [place],  [address] [phone]    Recent Labs: Lab Results  Component Value Date   WBC 5.6 05/01/2019   HGB 13.0 05/01/2019   PLT 200 05/01/2019   NA 142 12/31/2019   K 4.4 12/31/2019   CL 105 12/31/2019   CO2 25 12/31/2019   GLUCOSE 92 12/31/2019   BUN 13 12/31/2019   CREATININE 0.67 12/31/2019   BILITOT 0.6 12/31/2019   ALKPHOS 56 05/01/2019   AST 23 12/31/2019   ALT 14 12/31/2019   PROT 6.5 12/31/2019   PROT 6.7  12/31/2019   ALBUMIN 4.0 05/01/2019   CALCIUM 9.5 12/31/2019   GFRAA 112 12/31/2019    Speciality Comments: No specialty comments available.  Procedures:  No procedures performed Allergies: Patient has no known allergies.   Assessment / Plan:     Visit Diagnoses: Age-related osteoporosis without current pathological fracture - DEXA 11/25/2019 right femoral neck BMD 0.554 with T score -2.7.  She was given a prescription for Fosamax 70 mg 1 tablet by mouth once weekly in December 2021.  She states she took only 1 dose and discontinued the medication as she felt some discomfort in her right TMJ.  She was concerned about osteonecrosis of the jaw.  She states she has been doing a lot of reading.  We had detailed discussion regarding osteonecrosis of the jaw and the rare incidence in patients on IV bisphosphonates.  She does not require any oral surgery.  Side effects of Fosamax were again reviewed.  She wants to try Fosamax again.  I will send a prescription for Fosamax.  I would also check BMP and vitamin D level today.  Vitamin D was low in the past.  Use of calcium, vitamin D and resistive exercise was emphasized.  Vitamin D deficiency - Plan: VITAMIN D 25 Hydroxy (Vit-D Deficiency, Fractures)  Primary osteoarthritis of both hands -joint protection muscle strengthening was discussed.  She had no synovitis on examination.  RF negative, Anti-CCP negative  DDD (degenerative disc disease), cervical-she had good range of motion without discomfort.  DDD (degenerative disc disease), lumbar - Lumbar spine discectomy April 2021 by Dr. Ostergaard.  She continues to have some lower back discomfort and muscle spasms.  Chronic SI joint pain-she complains of poor SI joint discomfort.  There was no point tenderness.  Positive ANA (antinuclear antibody) -she has no clinical features of systemic lupus.  10/12/19: AVISE index -2/4: 1:80H, anti-histone weak positive, ENA negative:   Other  fatigue  Postmenopausal  History of gastroesophageal reflux (GERD)  Medication monitoring encounter - Plan: BASIC METABOLIC PANEL WITH GFR  Orders: Orders Placed This Encounter  Procedures  . BASIC METABOLIC PANEL WITH GFR  . VITAMIN D 25 Hydroxy (Vit-D Deficiency, Fractures)   Meds ordered this encounter  Medications  . alendronate (FOSAMAX) 70 MG tablet    Sig: Take 1 tablet (70 mg total) by mouth once a week. Take with a full glass of water on an empty stomach.    Dispense:  12 tablet      Refill:  0      Follow-Up Instructions: Return in about 6 months (around 12/30/2020) for Osteoarthritis, Osteoporosis.   Shaili Deveshwar, MD  Note - This record has been created using Dragon software.  Chart creation errors have been sought, but may not always  have been located. Such creation errors do not reflect on  the standard of medical care. 

## 2020-06-30 ENCOUNTER — Encounter: Payer: Self-pay | Admitting: Rheumatology

## 2020-06-30 ENCOUNTER — Other Ambulatory Visit: Payer: Self-pay

## 2020-06-30 ENCOUNTER — Ambulatory Visit (INDEPENDENT_AMBULATORY_CARE_PROVIDER_SITE_OTHER): Payer: 59 | Admitting: Rheumatology

## 2020-06-30 VITALS — BP 111/71 | HR 67 | Resp 14 | Ht 67.0 in | Wt 131.8 lb

## 2020-06-30 DIAGNOSIS — Z780 Asymptomatic menopausal state: Secondary | ICD-10-CM

## 2020-06-30 DIAGNOSIS — M810 Age-related osteoporosis without current pathological fracture: Secondary | ICD-10-CM

## 2020-06-30 DIAGNOSIS — Z5181 Encounter for therapeutic drug level monitoring: Secondary | ICD-10-CM

## 2020-06-30 DIAGNOSIS — E559 Vitamin D deficiency, unspecified: Secondary | ICD-10-CM | POA: Diagnosis not present

## 2020-06-30 DIAGNOSIS — R768 Other specified abnormal immunological findings in serum: Secondary | ICD-10-CM

## 2020-06-30 DIAGNOSIS — M19042 Primary osteoarthritis, left hand: Secondary | ICD-10-CM

## 2020-06-30 DIAGNOSIS — M5030 Other cervical disc degeneration, unspecified cervical region: Secondary | ICD-10-CM | POA: Diagnosis not present

## 2020-06-30 DIAGNOSIS — G8929 Other chronic pain: Secondary | ICD-10-CM

## 2020-06-30 DIAGNOSIS — M5136 Other intervertebral disc degeneration, lumbar region: Secondary | ICD-10-CM

## 2020-06-30 DIAGNOSIS — R5383 Other fatigue: Secondary | ICD-10-CM

## 2020-06-30 DIAGNOSIS — M533 Sacrococcygeal disorders, not elsewhere classified: Secondary | ICD-10-CM

## 2020-06-30 DIAGNOSIS — M19041 Primary osteoarthritis, right hand: Secondary | ICD-10-CM | POA: Diagnosis not present

## 2020-06-30 DIAGNOSIS — Z8719 Personal history of other diseases of the digestive system: Secondary | ICD-10-CM

## 2020-06-30 MED ORDER — ALENDRONATE SODIUM 70 MG PO TABS: 70.0000 mg | ORAL_TABLET | ORAL | 0 refills | Status: DC

## 2020-07-01 ENCOUNTER — Other Ambulatory Visit: Payer: Self-pay

## 2020-07-01 DIAGNOSIS — E559 Vitamin D deficiency, unspecified: Secondary | ICD-10-CM

## 2020-07-01 LAB — BASIC METABOLIC PANEL WITH GFR
BUN: 10 mg/dL (ref 7–25)
CO2: 31 mmol/L (ref 20–32)
Calcium: 10.2 mg/dL (ref 8.6–10.4)
Chloride: 102 mmol/L (ref 98–110)
Creat: 0.71 mg/dL (ref 0.50–1.05)
GFR, Est African American: 109 mL/min/{1.73_m2} (ref >=60)
GFR, Est Non African American: 94 mL/min/{1.73_m2} (ref >=60)
Glucose, Bld: 94 mg/dL (ref 65–99)
Potassium: 4.5 mmol/L (ref 3.5–5.3)
Sodium: 140 mmol/L (ref 135–146)

## 2020-07-01 LAB — VITAMIN D 25 HYDROXY (VIT D DEFICIENCY, FRACTURES): Vit D, 25-Hydroxy: 29 ng/mL — ABNORMAL LOW (ref 30–100)

## 2020-07-01 MED ORDER — VITAMIN D (ERGOCALCIFEROL) 1.25 MG (50000 UNIT) PO CAPS: 50000.0000 [IU] | ORAL_CAPSULE | ORAL | 0 refills | Status: DC

## 2020-07-01 NOTE — Progress Notes (Unsigned)
Per lab note from 06/30/2020, BMP WNL. Vitamin D is borderline low-29. Please notify the patient and send in vitamin D 50,000 units once weekly x3 months. Recheck vitamin D in 3 months.     Please review prescription and send to the pharmacy. Thanks!

## 2020-08-29 ENCOUNTER — Encounter: Payer: Self-pay | Admitting: Neurology

## 2020-08-29 ENCOUNTER — Ambulatory Visit (INDEPENDENT_AMBULATORY_CARE_PROVIDER_SITE_OTHER): Payer: 59 | Admitting: Neurology

## 2020-08-29 ENCOUNTER — Other Ambulatory Visit: Payer: Self-pay

## 2020-08-29 VITALS — BP 122/64 | HR 64 | Ht 67.5 in | Wt 129.0 lb

## 2020-08-29 DIAGNOSIS — M5410 Radiculopathy, site unspecified: Secondary | ICD-10-CM | POA: Diagnosis not present

## 2020-08-29 DIAGNOSIS — H02401 Unspecified ptosis of right eyelid: Secondary | ICD-10-CM

## 2020-08-29 NOTE — Progress Notes (Signed)
Reason for visit: Paresthesias  Maureen Davis is an 60 y.o. female  History of present illness:  Maureen Davis is a 60 year old right-handed white female with a history of prior lumbar spine surgery for herniated disc.  The patient underwent back surgery slightly over 1 year ago.  The patient still reports some paresthesias down the left leg, she has some cramps in the lateral calf muscle on the left, and some tingling to the foot.  MRI of the low back did not show any ongoing nerve root compression.  The patient has had an EMG and nerve conduction study that showed some acute and chronic neuropathic denervation that suggests S1 and some L5 nerve root involvement.  The patient has had a positive ANA in the past, a work-up through Dr. [name] did not confirm any connective tissue disease issue such as lupus.  The patient has a mother with Charcot Lelan Pons Tooth disease but the nerve conduction study on this patient did not show evidence of neuropathy.  The patient had a recent hemoglobin A1c of 5.6.  The patient denies any trouble controlling the bowels or the bladder.  She does have some fatigue and some muscle aches.  She has reported some transient drooping of the right eye that occurred transiently in December 2021 and then again recently.  No double vision was reported.  Past Medical History:  Diagnosis Date   Arthritis    osteo   Chronic UTI    Depression    Frequent UTI    --sees Dr.McDiarmid   Headache    migraines went away with ment   Hypertrophy of uterus    Leukopenia 2016   WBC 3.8   Osteoporosis    Polyp of corpus uteri    Uterine prolapse     Past Surgical History:  Procedure Laterality Date   CHOLECYSTECTOMY  1998   COLONOSCOPY     CYSTOSCOPY     LUMBAR LAMINECTOMY/ DECOMPRESSION WITH MET-RX Left 05/01/2019   Procedure: LUMBAR FOUR-FIVE LAMINECTOMY/ DECOMPRESSION WITH MET-RX;  Surgeon: [name], MD;  Location: [facility];  Service: Neurosurgery;  Laterality:  Left;   RETINAL TEAR REPAIR CRYOTHERAPY Right 2021   TUBAL LIGATION  1994    Family History  Problem Relation Age of Onset   Neuropathy Mother    Charcot-Marie-Tooth disease Mother    Hypertension Father    Osteoarthritis Father    Lupus Father    Thyroid disease Sister        removed    Healthy Son    Healthy Son    Healthy Son     Social history:  reports that she has never smoked. She has never used smokeless tobacco. She reports current alcohol use of about 2.0 - 3.0 standard drinks of alcohol per week. She reports that she does not use drugs.   No Known Allergies  Medications:  Prior to Admission medications   Medication Sig Start Date End Date Taking? Authorizing Provider  alendronate (FOSAMAX) 70 MG tablet Take 1 tablet (70 mg total) by mouth once a week. Take with a full glass of water on an empty stomach. 06/30/20  Yes Deveshwar, Abel Presto, MD  carisoprodol (SOMA) 350 MG tablet Take 350 mg by mouth as needed for muscle spasms.  05/22/18  Yes [provider]  citalopram (CELEXA) 10 MG tablet Take by mouth.   Yes [provider]  estradiol (ESTRACE) 0.1 MG/GM vaginal cream Use 1/2 g vaginally two or three times per week  as needed to maintain symptom relief. 06/02/20  Yes [name], MD  gabapentin (NEURONTIN) 100 MG capsule Take 2 capsules (200 mg total) by mouth at bedtime. 04/15/20  Yes Kathrynn Ducking, MD  Vitamin D, Ergocalciferol, (DRISDOL) 1.25 MG (50000 UNIT) CAPS capsule Take 1 capsule (50,000 Units total) by mouth every 7 (seven) days. 07/01/20  Yes [name], PA-C    ROS:  Out of a complete 14 system review of symptoms, the patient complains only of the following symptoms, and all other reviewed systems are negative.  Left leg paresthesias, muscle cramps Droopy eyelid, right  Blood pressure 122/64, pulse 64, height 5' 7.5" (1.715 m), weight 129 lb (58.5 kg), last menstrual period 10/20/2014, SpO2 97 %.  Physical Exam  General:  The patient is alert and cooperative at the time of the examination.  Skin: No significant peripheral edema is noted.   Neurologic Exam  Mental status: The patient is alert and oriented x 3 at the time of the examination. The patient has apparent normal recent and remote memory, with an apparently normal attention span and concentration ability.   Cranial nerves: Facial symmetry is present. Speech is normal, no aphasia or dysarthria is noted. Extraocular movements are full. Visual fields are full.  Motor: The patient has good strength in all 4 extremities.  Sensory examination: Soft touch sensation is symmetric on the face, arms, and legs.  Coordination: The patient has good finger-nose-finger and heel-to-shin bilaterally.  Gait and station: The patient has a normal gait. Tandem gait is normal. Romberg is negative. No drift is seen.  The patient is able to walk on heels and the toes bilaterally.  Reflexes: Deep tendon reflexes are symmetric.   MRI lumbar 06/09/20:   IMPRESSION:   Unremarkable MRI lumbar spine (with and without). No spinal stenosis or foraminal narrowing.    * MRI scan images were reviewed online. I agree with the written report.    Assessment/Plan:  1.  Left leg paresthesias, cramps  The patient did have an abnormal EMG and nerve conduction study, it is possible that some of the changes seen are still a result of the healing phase following back surgery.  The patient has undergone a recent injection in the back through Dr. Davy Pique from neurosurgery, the patient has had some mild dizziness following this.  The patient will be sent for blood work today looking for vasculitic cause of neuropathic changes.  She will follow-up here in 6 months.  In the future, she can be seen through Dr. Krista Blue.  Jill Alexanders MD 08/29/2020 9:34 AM  [facility] [address] [place], [place] [address]  Phone [phone] Fax [phone]

## 2020-08-31 LAB — SEDIMENTATION RATE: Sed Rate: 2 mm/hr (ref 0–40)

## 2020-08-31 LAB — LYME DISEASE SEROLOGY W/REFLEX: Lyme Total Antibody EIA: NEGATIVE

## 2020-08-31 LAB — ANGIOTENSIN CONVERTING ENZYME: Angio Convert Enzyme: 36 U/L (ref 14–82)

## 2020-08-31 LAB — ANA W/REFLEX: Anti Nuclear Antibody (ANA): NEGATIVE

## 2020-08-31 LAB — ACETYLCHOLINE RECEPTOR, BINDING: AChR Binding Ab, Serum: <0.03 nmol/L (ref 0.00–0.24)

## 2020-08-31 LAB — HEPATITIS C ANTIBODY: Hep C Virus Ab: <0.1 s/co ratio (ref 0.0–0.9)

## 2020-10-11 ENCOUNTER — Encounter: Payer: Self-pay | Admitting: Obstetrics and Gynecology

## 2020-11-08 ENCOUNTER — Other Ambulatory Visit: Payer: Self-pay | Admitting: Physician Assistant

## 2020-12-08 ENCOUNTER — Other Ambulatory Visit: Payer: Self-pay | Admitting: Family Medicine

## 2020-12-08 ENCOUNTER — Other Ambulatory Visit: Payer: Self-pay

## 2020-12-08 ENCOUNTER — Ambulatory Visit
Admission: RE | Admit: 2020-12-08 | Discharge: 2020-12-08 | Disposition: A | Discharge status: Home / Self Care | Payer: 59 | Source: Ambulatory Visit | Attending: Family Medicine | Admitting: Family Medicine

## 2020-12-08 DIAGNOSIS — R059 Cough, unspecified: Secondary | ICD-10-CM

## 2020-12-09 ENCOUNTER — Other Ambulatory Visit: Payer: Self-pay | Admitting: Rheumatology

## 2020-12-09 NOTE — Telephone Encounter (Signed)
Next Visit: 12/30/2020  Last Visit: 06/30/2020  Last Fill: 06/30/2020  DX: Age-related osteoporosis without current pathological fracture   Current Dose per office note 06/30/2020: Fosamax 70 mg 1 tablet by mouth once weekly  Labs: 07/07/2020 WBC 3.2, RBC 4.18  Okay to refill Fosamax?

## 2020-12-23 NOTE — Progress Notes (Signed)
Office Visit Note  Patient: Maureen Davis             Date of Birth: Jul 30, 1960           MRN: [id number]             PCP: Jonathon Jordan, MD Referring: Jonathon Jordan, MD Visit Date: 12/30/2020 Occupation: @GUAROCC @  Subjective:  Medication management.   History of Present Illness: Maureen Davis is a 60 y.o. female with a history of osteoarthritis, osteoporosis and degenerative disc disease.  She states she is off-and-on discomfort in her spine due to disc disease.  She is overall doing well.  She continues to have neuropathy in her lower extremities.  She takes gabapentin occasionally.  She finished the course of vitamin D 50,000 units daily.  Now she is taking over-the-counter vitamin D.  She has some stiffness in her hands but no significant joint pain.  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  She denies any history of oral ulcers, nasal ulcers, malar rash, photosensitivity, sicca symptoms, raynaud's phenominon or lymphadenopathy.  Activities of Daily Living:  Patient reports morning stiffness for 0 minute.   Patient Reports nocturnal pain.  Difficulty dressing/grooming: Denies Difficulty climbing stairs: Denies Difficulty getting out of chair: Denies Difficulty using hands for taps, buttons, cutlery, and/or writing: Denies  Review of Systems  Constitutional:  Negative for fatigue.  HENT:  Negative for mouth sores, mouth dryness and nose dryness.   Eyes:  Negative for pain, itching and dryness.  Respiratory:  Negative for shortness of breath and difficulty breathing.   Cardiovascular:  Negative for chest pain and palpitations.  Gastrointestinal:  Negative for blood in stool, constipation and diarrhea.  Endocrine: Negative for increased urination.  Genitourinary:  Negative for difficulty urinating.  Musculoskeletal:  Negative for joint pain, joint pain, joint swelling, myalgias, morning stiffness, muscle tenderness and  myalgias.  Skin:  Negative for color change, rash, redness and sensitivity to sunlight.  Allergic/Immunologic: Negative for susceptible to infections.  Neurological:  Positive for numbness. Negative for dizziness, headaches, memory loss and weakness.  Hematological:  Negative for bruising/bleeding tendency and swollen glands.  Psychiatric/Behavioral:  Negative for depressed mood, confusion and sleep disturbance. The patient is not nervous/anxious.    PMFS History:  Patient Active Problem List   Diagnosis Date Noted   Primary osteoarthritis of both hands 11/10/2019   DDD (degenerative disc disease), lumbar 11/10/2019   Lumbar radiculopathy 04/30/2019   DDD (degenerative disc disease), cervical 08/07/2017   Coccygodynia 08/07/2017   Hx of migraines 07/18/2017   History of gastroesophageal reflux (GERD) 07/18/2017   History of depression 07/18/2017   Recurrent UTI 07/18/2017   Leukopenia 07/18/2017    Past Medical History:  Diagnosis Date   Arthritis    osteo   Chronic UTI    Depression    Frequent UTI    --sees Dr.McDiarmid   Headache    migraines went away with ment   Hypertrophy of uterus    Leukopenia 2016   WBC 3.8   Osteoporosis    Polyp of corpus uteri    Uterine prolapse     Family History  Problem Relation Age of Onset   Neuropathy Mother    Charcot-Marie-Tooth disease Mother    Hypertension Father    Osteoarthritis Father    Lupus Father    Thyroid disease Sister        removed  Healthy Son    Healthy Son    Healthy Son    Past Surgical History:  Procedure Laterality Date   CHOLECYSTECTOMY  1998   COLONOSCOPY     CYSTOSCOPY     LUMBAR LAMINECTOMY/ DECOMPRESSION WITH MET-RX Left 05/01/2019   Procedure: LUMBAR FOUR-FIVE LAMINECTOMY/ DECOMPRESSION WITH MET-RX;  Surgeon: [name], MD;  Location: [facility];  Service: Neurosurgery;  Laterality: Left;   RETINAL TEAR REPAIR CRYOTHERAPY Right 2021   TUBAL LIGATION  1994   Social History   Social  History Narrative   Not on file   Immunization History  Administered Date(s) Administered   PFIZER(Purple Top)SARS-COV-2 Vaccination 06/07/2019, 06/28/2019     Objective: Vital Signs: BP 109/71 (BP Location: Left Arm, Patient Position: Sitting, Cuff Size: Normal)   Pulse 71   Ht $R'5\' 7"'Cm$  (1.702 m)   Wt 132 lb 12.8 oz (60.2 kg)   LMP 10/20/2014 (Exact Date)   BMI 20.80 kg/m    Physical Exam Vitals and nursing note reviewed.  Constitutional:      Appearance: She is well-developed.  HENT:     Head: Normocephalic and atraumatic.  Eyes:     Conjunctiva/sclera: Conjunctivae normal.  Cardiovascular:     Rate and Rhythm: Normal rate and regular rhythm.     Heart sounds: Normal heart sounds.  Pulmonary:     Effort: Pulmonary effort is normal.     Breath sounds: Normal breath sounds.  Abdominal:     General: Bowel sounds are normal.     Palpations: Abdomen is soft.  Musculoskeletal:     Cervical back: Normal range of motion.  Lymphadenopathy:     Cervical: No cervical adenopathy.  Skin:    General: Skin is warm and dry.     Capillary Refill: Capillary refill takes less than 2 seconds.  Neurological:     Mental Status: She is alert and oriented to person, place, and time.  Psychiatric:        Behavior: Behavior normal.     Musculoskeletal Exam: C-spine was in good range of motion.  She had good range of motion of her thoracic spine.  Some limitation in lumbar spine was noted.  There was no tenderness over the spine.  Shoulder joints, elbow joints, wrist joints, MCPs PIPs and DIPs with good range of motion.  She had bilateral CMC PIP and DIP thickening with no synovitis.  Hip joints, knee joints, ankles, MTPs and PIPs with good range of motion with no synovitis.  CDAI Exam: CDAI Score: -- Patient Global: --; Provider Global: -- Swollen: --; Tender: -- Joint Exam 12/30/2020   No joint exam has been documented for this visit   There is currently no information documented on  the homunculus. Go to the Rheumatology activity and complete the homunculus joint exam.  Investigation: No additional findings.  Imaging: DG Chest 2 View  Result Date: 12/09/2020 CLINICAL DATA:  Cough with chest tightness for 1 month. EXAM: CHEST - 2 VIEW COMPARISON:  None. FINDINGS: The heart size and mediastinal contours are within normal limits. Both lungs are clear. The visualized skeletal structures are unremarkable. IMPRESSION: No active cardiopulmonary disease. Electronically Signed   By: Kathreen Devoid M.D.   On: 12/09/2020 08:25    Recent Labs: Lab Results  Component Value Date   WBC 5.6 05/01/2019   HGB 13.0 05/01/2019   PLT 200 05/01/2019   NA 140 06/30/2020   K 4.5 06/30/2020   CL 102 06/30/2020   CO2 31 06/30/2020  GLUCOSE 94 06/30/2020   BUN 10 06/30/2020   CREATININE 0.71 06/30/2020   BILITOT 0.6 12/31/2019   ALKPHOS 56 05/01/2019   AST 23 12/31/2019   ALT 14 12/31/2019   PROT 6.5 12/31/2019   PROT 6.7 12/31/2019   ALBUMIN 4.0 05/01/2019   CALCIUM 10.2 06/30/2020   GFRAA 109 06/30/2020    Speciality Comments: No specialty comments available.  Procedures:  No procedures performed Allergies: Patient has no known allergies.   Assessment / Plan:     Visit Diagnoses: Age-related osteoporosis without current pathological fracture - DEXA 11/25/2019 right femoral neck BMD 0.554 with T score -2.7.  She was given a prescription for Fosamax 70 mg 1 tablet by mouth once weekly in December 2021. -She has been tolerating Fosamax without any side effects.  She has been also taking calcium rich diet and vitamin D.  She finished the vitamin D prescription and now is taking over-the-counter vitamin D.  Plan: VITAMIN D 25 Hydroxy (Vit-D Deficiency, Fractures)  Vitamin D deficiency -we will check vitamin D level today.  Plan: VITAMIN D 25 Hydroxy (Vit-D Deficiency, Fractures)  Primary osteoarthritis of both hands-she has osteoarthritis in her hands with CMC PIP and DIP  thickening.  Joint protection muscle strengthening was discussed.  DDD (degenerative disc disease), cervical-she had good range of motion of her cervical spine.  DDD (degenerative disc disease), lumbar - Lumbar spine discectomy April 2021 by Dr. Venetia Constable.  She has residual neuropathy in her bilateral lower extremities.  Her lower back pain has improved since she switched jobs.  Chronic SI joint pain-improved.  Positive ANA (antinuclear antibody) - no clinical features of systemic lupus.  10/12/19: AVISE index -2/4: 1:80H, anti-histone weak positive, ENA negative: She has no clinical features of autoimmune disease on examination today.  Other fatigue-improved.  History of gastroesophageal reflux (GERD)  History of depression  Tinnitus of both ears  Medication management - Plan: CBC with Differential/Platelet, COMPLETE METABOLIC PANEL WITH GFR  Orders: Orders Placed This Encounter  Procedures   CBC with Differential/Platelet   COMPLETE METABOLIC PANEL WITH GFR   VITAMIN D 25 Hydroxy (Vit-D Deficiency, Fractures)    No orders of the defined types were placed in this encounter.    Follow-Up Instructions: Return in about 6 months (around 06/30/2021) for Osteoarthritis, Osteoporosis.   Bo Merino, MD  Note - This record has been created using Editor, commissioning.  Chart creation errors have been sought, but may not always  have been located. Such creation errors do not reflect on  the standard of medical care.

## 2020-12-30 ENCOUNTER — Other Ambulatory Visit: Payer: Self-pay

## 2020-12-30 ENCOUNTER — Encounter: Payer: Self-pay | Admitting: Rheumatology

## 2020-12-30 ENCOUNTER — Ambulatory Visit (INDEPENDENT_AMBULATORY_CARE_PROVIDER_SITE_OTHER): Payer: 59 | Admitting: Rheumatology

## 2020-12-30 VITALS — BP 109/71 | HR 71 | Ht 67.0 in | Wt 132.8 lb

## 2020-12-30 DIAGNOSIS — R768 Other specified abnormal immunological findings in serum: Secondary | ICD-10-CM

## 2020-12-30 DIAGNOSIS — M19041 Primary osteoarthritis, right hand: Secondary | ICD-10-CM

## 2020-12-30 DIAGNOSIS — M5136 Other intervertebral disc degeneration, lumbar region: Secondary | ICD-10-CM

## 2020-12-30 DIAGNOSIS — M19042 Primary osteoarthritis, left hand: Secondary | ICD-10-CM

## 2020-12-30 DIAGNOSIS — R5383 Other fatigue: Secondary | ICD-10-CM

## 2020-12-30 DIAGNOSIS — E559 Vitamin D deficiency, unspecified: Secondary | ICD-10-CM

## 2020-12-30 DIAGNOSIS — H9313 Tinnitus, bilateral: Secondary | ICD-10-CM

## 2020-12-30 DIAGNOSIS — G8929 Other chronic pain: Secondary | ICD-10-CM

## 2020-12-30 DIAGNOSIS — Z8719 Personal history of other diseases of the digestive system: Secondary | ICD-10-CM

## 2020-12-30 DIAGNOSIS — Z8659 Personal history of other mental and behavioral disorders: Secondary | ICD-10-CM

## 2020-12-30 DIAGNOSIS — M810 Age-related osteoporosis without current pathological fracture: Secondary | ICD-10-CM | POA: Diagnosis not present

## 2020-12-30 DIAGNOSIS — M5030 Other cervical disc degeneration, unspecified cervical region: Secondary | ICD-10-CM

## 2020-12-30 DIAGNOSIS — M533 Sacrococcygeal disorders, not elsewhere classified: Secondary | ICD-10-CM

## 2020-12-30 DIAGNOSIS — Z79899 Other long term (current) drug therapy: Secondary | ICD-10-CM

## 2020-12-31 LAB — CBC WITH DIFFERENTIAL/PLATELET
Absolute Monocytes: 511 cells/uL (ref 200–950)
Basophils Absolute: 32 cells/uL (ref 0–200)
Basophils Relative: 0.7 %
Eosinophils Absolute: 189 cells/uL (ref 15–500)
Eosinophils Relative: 4.1 %
HCT: 40.4 % (ref 35.0–45.0)
Hemoglobin: 13 g/dL (ref 11.7–15.5)
Lymphs Abs: 874 cells/uL (ref 850–3900)
MCH: 30.4 pg (ref 27.0–33.0)
MCHC: 32.2 g/dL (ref 32.0–36.0)
MCV: 94.6 fL (ref 80.0–100.0)
MPV: 11.1 fL (ref 7.5–12.5)
Monocytes Relative: 11.1 %
Neutro Abs: 2995 cells/uL (ref 1500–7800)
Neutrophils Relative %: 65.1 %
Platelets: 186 10*3/uL (ref 140–400)
RBC: 4.27 10*6/uL (ref 3.80–5.10)
RDW: 12.2 % (ref 11.0–15.0)
Total Lymphocyte: 19 %
WBC: 4.6 10*3/uL (ref 3.8–10.8)

## 2020-12-31 LAB — COMPLETE METABOLIC PANEL WITH GFR
AG Ratio: 2 (calc) (ref 1.0–2.5)
ALT: 21 U/L (ref 6–29)
AST: 26 U/L (ref 10–35)
Albumin: 4.3 g/dL (ref 3.6–5.1)
Alkaline phosphatase (APISO): 51 U/L (ref 37–153)
BUN: 14 mg/dL (ref 7–25)
CO2: 27 mmol/L (ref 20–32)
Calcium: 9 mg/dL (ref 8.6–10.4)
Chloride: 106 mmol/L (ref 98–110)
Creat: 0.72 mg/dL (ref 0.50–1.03)
Globulin: 2.2 g/dL (calc) (ref 1.9–3.7)
Glucose, Bld: 96 mg/dL (ref 65–99)
Potassium: 4.4 mmol/L (ref 3.5–5.3)
Sodium: 141 mmol/L (ref 135–146)
Total Bilirubin: 0.6 mg/dL (ref 0.2–1.2)
Total Protein: 6.5 g/dL (ref 6.1–8.1)
eGFR: 96 mL/min/{1.73_m2} (ref >=60)

## 2020-12-31 LAB — VITAMIN D 25 HYDROXY (VIT D DEFICIENCY, FRACTURES): Vit D, 25-Hydroxy: 69 ng/mL (ref 30–100)

## 2020-12-31 NOTE — Progress Notes (Signed)
CBC, CMP and vitamin D are within normal limits.  She should take over-the-counter vitamin D 2000 units daily.

## 2021-03-01 ENCOUNTER — Ambulatory Visit (INDEPENDENT_AMBULATORY_CARE_PROVIDER_SITE_OTHER): Payer: 59 | Admitting: Neurology

## 2021-03-01 ENCOUNTER — Ambulatory Visit: Payer: 59 | Admitting: Neurology

## 2021-03-01 ENCOUNTER — Encounter: Payer: Self-pay | Admitting: Neurology

## 2021-03-01 VITALS — BP 110/66 | HR 100 | Ht 67.0 in | Wt 132.0 lb

## 2021-03-01 DIAGNOSIS — R202 Paresthesia of skin: Secondary | ICD-10-CM

## 2021-03-01 DIAGNOSIS — M5416 Radiculopathy, lumbar region: Secondary | ICD-10-CM

## 2021-03-01 MED ORDER — GABAPENTIN 100 MG PO CAPS: 200.0000 mg | ORAL_CAPSULE | Freq: Every day | ORAL | 1 refills | Status: DC

## 2021-03-01 MED ORDER — DULOXETINE HCL 20 MG PO CPEP: 20.0000 mg | ORAL_CAPSULE | Freq: Every day | ORAL | 5 refills | Status: DC

## 2021-03-01 NOTE — Progress Notes (Signed)
PATIENT: Maureen Davis DOB: May 27, 1960  REASON FOR VISIT: Follow up for paresthesia HISTORY FROM: Patient PRIMARY NEUROLOGIST: Dr. Terrace Arabia since Dr. Anne Hahn retired   HISTORY OF PRESENT ILLNESS: Today 03/01/21 Maureen Davis is here today for follow-up with history of paresthesias.  Has prior history of lumbar spine surgery.  MRI of the lumbar spine did not show any ongoing nerve root compression. EMG/NCV showed some acute and chronic neuropathic denervation that suggested S1 and some L5 nerve root involvement.  Follows with Dr. Corliss Skains for a positive ANA.  Her mother has Charcot-Marie-Tooth disease.  Acetylcholine receptor binding, ANA, Lyme disease, angiotensin-converting enzyme, hepatitis C, sed rate were unremarkable. Still feet feel like walking on rocks, left leg is worse, stabing with sharp object in left leg. Left hip gets tight. Still does PT exercises at home. Crawling sensation to low back and hip at night. Takes gabapentin up to 200 mg at bedtime, it helps her sleep. Did 1 injection with Dr. Lorrine Kin it helped, had bruising dots to various area. Still sleeps with ice and heat or back. 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 She is tearful discussing. Here today alone.   HISTORY  08/29/2020 Dr. Anne Hahn: Mrs. Strayer is a 61 year old right-handed white female with a history of prior lumbar spine surgery for herniated disc.  The patient underwent back surgery slightly over 1 year ago.  The patient still reports some paresthesias down the left leg, she has some cramps in the lateral calf muscle on the left, and some tingling to the foot.  MRI of the low back did not show any ongoing nerve root compression.  The patient has had an EMG and nerve conduction study that showed some acute and chronic neuropathic denervation that suggests S1 and some L5 nerve root involvement.  The patient has had a positive ANA in the past, a work-up through Dr. Corliss Skains did not confirm any connective tissue disease  issue such as lupus.  The patient has a mother with Charcot Hilda Lias Tooth disease but the nerve conduction study on this patient did not show evidence of neuropathy.  The patient had a recent hemoglobin A1c of 5.6.  The patient denies any trouble controlling the bowels or the bladder.  She does have some fatigue and some muscle aches.  She has reported some transient drooping of the right eye that occurred transiently in December 2021 and then again recently.  No double vision was reported.  REVIEW OF SYSTEMS: Out of a complete 14 system review of symptoms, the patient complains only of the following symptoms, and all other reviewed systems are negative.  See HPI  ALLERGIES: No Known Allergies  HOME MEDICATIONS: Outpatient Medications Prior to Visit  Medication Sig Dispense Refill   alendronate (FOSAMAX) 70 MG tablet TAKE 1 TABLET ONCE A WEEK. TAKE WITH A FULL GLASS OF WATER ON AN EMPTY STOMACH. 4 tablet 2   carisoprodol (SOMA) 350 MG tablet Take 350 mg by mouth as needed for muscle spasms.     citalopram (CELEXA) 10 MG tablet Take by mouth.     estradiol (ESTRACE) 0.1 MG/GM vaginal cream Use 1/2 g vaginally two or three times per week as needed to maintain symptom relief. 42.5 g 2   gabapentin (NEURONTIN) 100 MG capsule Take 2 capsules (200 mg total) by mouth at bedtime. 60 capsule 3   No facility-administered medications prior to visit.    PAST MEDICAL HISTORY: Past Medical History:  Diagnosis Date   Arthritis  osteo   Chronic UTI    Depression    Frequent UTI    --sees Dr.McDiarmid   Headache    migraines went away with ment   Hypertrophy of uterus    Leukopenia 2016   WBC 3.8   Osteoporosis    Polyp of corpus uteri    Uterine prolapse     PAST SURGICAL HISTORY: Past Surgical History:  Procedure Laterality Date   CHOLECYSTECTOMY  1998   COLONOSCOPY     CYSTOSCOPY     LUMBAR LAMINECTOMY/ DECOMPRESSION WITH MET-RX Left 05/01/2019   Procedure: LUMBAR FOUR-FIVE  LAMINECTOMY/ DECOMPRESSION WITH MET-RX;  Surgeon: Jadene Pierini, MD;  Location: [facility];  Service: Neurosurgery;  Laterality: Left;   RETINAL TEAR REPAIR CRYOTHERAPY Right 2021   TUBAL LIGATION  1994    FAMILY HISTORY: Family History  Problem Relation Age of Onset   Neuropathy Mother    Charcot-Marie-Tooth disease Mother    Hypertension Father    Osteoarthritis Father    Lupus Father    Thyroid disease Sister        removed    Healthy Son    Healthy Son    Healthy Son     SOCIAL HISTORY: Social History   Socioeconomic History   Marital status: Married    Spouse name: Not on file   Number of children: Not on file   Years of education: Not on file   Highest education level: Not on file  Occupational History   Not on file  Tobacco Use   Smoking status: Never   Smokeless tobacco: Never  Vaping Use   Vaping Use: Never used  Substance and Sexual Activity   Alcohol use: Yes    Alcohol/week: 2.0 - 3.0 standard drinks    Types: 2 - 3 Standard drinks or equivalent per week    Comment: occ   Drug use: Never   Sexual activity: Yes    Partners: Male    Birth control/protection: Surgical    Comment: Tubal Ligation  Other Topics Concern   Not on file  Social History Narrative   Not on file   Social Determinants of Health   Financial Resource Strain: Not on file  Food Insecurity: Not on file  Transportation Needs: Not on file  Physical Activity: Not on file  Stress: Not on file  Social Connections: Not on file  Intimate Partner Violence: Not on file   PHYSICAL EXAM  Vitals:   03/01/21 0943  BP: 110/66  Pulse: 100  Weight: 132 lb (59.9 kg)  Height: 5\' 7"  (1.702 m)   Body mass index is 20.67 kg/m.  Generalized: Well developed, in no acute distress   Neurological examination  Mentation: Alert oriented to time, place, history taking. Follows all commands speech and language fluent Cranial nerve II-XII: Pupils were equal round reactive to light. Extraocular  movements were full, visual field were full on confrontational test. Facial sensation and strength were normal. Head turning and shoulder shrug  were normal and symmetric. Motor: The motor testing reveals 5 over 5 strength of all 4 extremities. Good symmetric motor tone is noted throughout.  Sensory: Sensory testing is intact to soft touch on all 4 extremities. No evidence of extinction is noted. Reported decreased pin prick to left mid foot, vibration; right is normal. Coordination: Cerebellar testing reveals good finger-nose-finger and heel-to-shin bilaterally.  Gait and station: Gait is normal. Tandem gait is normal.  Reflexes: Deep tendon reflexes are symmetric and normal bilaterally.  DIAGNOSTIC DATA (LABS, IMAGING, TESTING) - I reviewed patient records, labs, notes, testing and imaging myself where available.  Lab Results  Component Value Date   WBC 4.6 12/30/2020   HGB 13.0 12/30/2020   HCT 40.4 12/30/2020   MCV 94.6 12/30/2020   PLT 186 12/30/2020      Component Value Date/Time   NA 141 12/30/2020 0936   K 4.4 12/30/2020 0936   CL 106 12/30/2020 0936   CO2 27 12/30/2020 0936   GLUCOSE 96 12/30/2020 0936   BUN 14 12/30/2020 0936   CREATININE 0.72 12/30/2020 0936   CALCIUM 9.0 12/30/2020 0936   PROT 6.5 12/30/2020 0936   ALBUMIN 4.0 05/01/2019 0631   AST 26 12/30/2020 0936   ALT 21 12/30/2020 0936   ALKPHOS 56 05/01/2019 0631   BILITOT 0.6 12/30/2020 0936   GFRNONAA 94 06/30/2020 1013   GFRAA 109 06/30/2020 1013   Lab Results  Component Value Date   CHOL 196 12/30/2015   HDL 97 12/30/2015   LDLCALC 81 12/30/2015   TRIG 88 12/30/2015   CHOLHDL 2.0 12/30/2015   No results found for: HGBA1C No results found for: VITAMINB12 Lab Results  Component Value Date   TSH 0.51 12/31/2019      ASSESSMENT AND PLAN 61 y.o. year old female  has a past medical history of Arthritis, Chronic UTI, Depression, Frequent UTI, Headache, Hypertrophy of uterus, Leukopenia (2016),  Osteoporosis, Polyp of corpus uteri, and Uterine prolapse. here with:  1.  Left leg paresthesia 2.  Post lumbar surgery 3.  Maternal history of Charcot-Marie-Tooth disease 4.  Positive ANA, follows with rheumatology, no clinical features of Lupus  -Reviewed previous NCV/EMG with Dr. Krista Blue, not felt to need to be repeated at this time, showed mild chronic stable signs of an L5 radiculopathy with evidence of mild acute denervation in the S1 nerve root distribution of the left lower extremity, EMG of the right lower extremity showed mild acute and chronic changes in S1 nerve root distribution.  There was no evidence of neuropathy. -MRI lumbar spine May 2022 was unremarkable -Acetylcholine receptor binding, ANA, angiotensin-converting enzyme, hepatitis C, Lyme disease, sed rate was unremarkable in August 2022 -Continue gabapentin 200 mg at bedtime for paresthesias -Add on Cymbalta 20 mg daily to help with symptoms   -Return back in 6 months with Dr. Marilynn Latino, AGNP-C, DNP 03/01/2021, 10:58 AM [facility] [address] [place], [place] [address] [phone]

## 2021-03-03 NOTE — Progress Notes (Signed)
Chart reviewed, agree above plan ?

## 2021-03-23 ENCOUNTER — Other Ambulatory Visit: Payer: Self-pay | Admitting: Neurology

## 2021-03-23 NOTE — Telephone Encounter (Signed)
Rx refilled for 90 day supply. 

## 2021-04-02 ENCOUNTER — Other Ambulatory Visit: Payer: Self-pay | Admitting: Physician Assistant

## 2021-04-03 NOTE — Telephone Encounter (Signed)
Next Visit: 06/28/2021 ? ?Last Visit: 12/30/2020  ? ?Last Fill: 12/09/2020 ? ?DX: Age-related osteoporosis without current pathological fracture  ? ?Current Dose per office note 12/30/2020: Fosamax 70 mg 1 tablet by mouth once weekly in December 2021.  ? ?Labs: 12/30/2020 CBC, CMP and vitamin D are within normal limits. ? ?Okay to refill Fosamax?  ?

## 2021-05-29 NOTE — Progress Notes (Signed)
61 y.o. [id number] Married Caucasian female here for annual exam.   ? ?Wants to continue with vaginal estrogen cream.  ? ?Takes Citracal twice daily.  ?On Fosamax.  ? ?PCP:   Jonathon Jordan, MD ? ?Patient's last menstrual period was 10/20/2014 (exact date).     ?  ?    ?Sexually active: Yes.    ?The current method of family planning is tubal ligation.    ?Exercising: Yes.    Home exercise. ?Smoker:  no ? ?Health Maintenance: ?Pap:  01-10-18 Normal Neg HR HPV,12-21-14 Normal Neg HR HPV ?History of abnormal Pap:  yes 1994 Cryo ?MMG:  10-11-20 Normal Cat.C Bi-RADS 1 ?Colonoscopy:  2016 Normal 10 yr. follow ?BMD:   11-25-19   Result  Osteoporosis ?TDaP:  PCP ?Gardasil:   no ?EKC:MKLKJ ?Hep C:never ?Screening Labs:  Hb today: PCP, Urine today: PCP ? ? reports that she has never smoked. She has never used smokeless tobacco. She reports that she does not currently use alcohol. She reports that she does not use drugs. ? ?Past Medical History:  ?Diagnosis Date  ? Arthritis   ? osteo  ? Chronic UTI   ? Depression   ? Frequent UTI   ? --sees Dr.McDiarmid  ? Headache   ? migraines went away with ment  ? Hypertrophy of uterus   ? Leukopenia 2016  ? WBC 3.8  ? Osteoporosis   ? Polyp of corpus uteri   ? Uterine prolapse   ? ? ?Past Surgical History:  ?Procedure Laterality Date  ? CHOLECYSTECTOMY  1998  ? COLONOSCOPY    ? CYSTOSCOPY    ? LUMBAR LAMINECTOMY/ DECOMPRESSION WITH MET-RX Left 05/01/2019  ? Procedure: LUMBAR FOUR-FIVE LAMINECTOMY/ DECOMPRESSION WITH MET-RX;  Surgeon: [name], MD;  Location: [facility];  Service: Neurosurgery;  Laterality: Left;  ? RETINAL TEAR REPAIR CRYOTHERAPY Right 2021  ? TUBAL LIGATION  1994  ? ? ?Current Outpatient Medications  ?Medication Sig Dispense Refill  ? alendronate (FOSAMAX) 70 MG tablet TAKE 1 TABLET ONCE A WEEK. TAKE WITH A FULL GLASS OF WATER ON AN EMPTY STOMACH. 4 tablet 2  ? carisoprodol (SOMA) 350 MG tablet Take 350 mg by mouth as needed for muscle spasms.    ? citalopram (CELEXA)  10 MG tablet Take by mouth.    ? estradiol (ESTRACE) 0.1 MG/GM vaginal cream Use 1/2 g vaginally two or three times per week as needed to maintain symptom relief. 42.5 g 2  ? gabapentin (NEURONTIN) 100 MG capsule Take 2 capsules (200 mg total) by mouth at bedtime. 180 capsule 1  ? ?No current facility-administered medications for this visit.  ? ? ?Family History  ?Problem Relation Age of Onset  ? Neuropathy Mother   ? Charcot-Marie-Tooth disease Mother   ? Hypertension Father   ? Osteoarthritis Father   ? Lupus Father   ? Thyroid disease Sister   ?     removed   ? Healthy Son   ? Healthy Son   ? Healthy Son   ? ? ?Review of Systems  ?All other systems reviewed and are negative. ? ?Exam:   ?BP 114/66 (BP Location: Right Arm, Patient Position: Sitting, Cuff Size: Normal)   Pulse 82   Ht $R'5\' 7"'ca$  (1.702 m)   Wt 126 lb (57.2 kg)   LMP 10/20/2014 (Exact Date)   SpO2 98%   BMI 19.73 kg/m?     ?General appearance: alert, cooperative and appears stated age ?Head: normocephalic, without obvious abnormality, atraumatic ?Neck: no  adenopathy, supple, symmetrical, trachea midline and thyroid normal to inspection and palpation ?Lungs: clear to auscultation bilaterally ?Breasts: normal appearance, no masses or tenderness, No nipple retraction or dimpling, No nipple discharge or bleeding, No axillary adenopathy ?Heart: regular rate and rhythm ?Abdomen: soft, non-tender; no masses, no organomegaly ?Extremities: extremities normal, atraumatic, no cyanosis or edema ?Skin: skin color, texture, turgor normal. No rashes or lesions ?Lymph nodes: cervical, supraclavicular, and axillary nodes normal. ?Neurologic: grossly normal ? ?Pelvic: External genitalia:  no lesions ?             No abnormal inguinal nodes palpated. ?             Urethra:  normal appearing urethra with no masses, tenderness or lesions ?             Bartholins and Skenes: normal    ?             Vagina: normal appearing vagina with normal color and discharge, no  lesions ?             Cervix: no lesions ?             Pap taken: no ?Bimanual Exam:  Uterus:  normal size, contour, position, consistency, mobility, non-tender ?             Adnexa: no mass, fullness, tenderness ?             Rectal exam: yes.  Confirms. ?             Anus:  normal sphincter tone, no lesions ? ?Chaperone was present for exam:  Maudie Mercury, CMA ? ?Assessment:   ?Well woman visit with gynecologic exam. ?Hx UTIs.  ?Osteoarthritis.  ?Vaginal atrophy. ?Osteoporosis.  On Fosamax.  Rheumatology following.  ? ?Plan: ?Mammogram screening discussed. ?Self breast awareness reviewed. ?Pap and HR HPV 2024. ?Guidelines for Calcium, Vitamin D, regular exercise program including cardiovascular and weight bearing exercise. ?Refill of vaginal estrogen cream.  I discussed benefit for treating atrophy and prevention of postmenopausal UTI and negative effect on breast cancer.  ?Follow up annually and prn.  ? ?After visit summary provided.  ? ? ?  ?

## 2021-06-09 ENCOUNTER — Ambulatory Visit (INDEPENDENT_AMBULATORY_CARE_PROVIDER_SITE_OTHER): Payer: 59 | Admitting: Obstetrics and Gynecology

## 2021-06-09 ENCOUNTER — Encounter: Payer: Self-pay | Admitting: Obstetrics and Gynecology

## 2021-06-09 VITALS — BP 114/66 | HR 82 | Ht 67.0 in | Wt 126.0 lb

## 2021-06-09 DIAGNOSIS — Z01419 Encounter for gynecological examination (general) (routine) without abnormal findings: Secondary | ICD-10-CM

## 2021-06-09 MED ORDER — ESTRADIOL 0.1 MG/GM VA CREA: TOPICAL_CREAM | VAGINAL | 2 refills | Status: DC

## 2021-06-09 NOTE — Patient Instructions (Signed)

## 2021-06-14 NOTE — Progress Notes (Unsigned)
Office Visit Note  Patient: Maureen Davis             Date of Birth: 07/29/1960           MRN: [id number]             PCP: Jonathon Jordan, MD Referring: Jonathon Jordan, MD Visit Date: 06/28/2021 Occupation: @GUAROCC @  Subjective:  Medication monitoring   History of Present Illness: Maureen Davis is a 62 y.o. female with history of osteoporosis, osteoarthritis, and DDD. She is taking fosamax 70 mg 1 tablet by mouth once weekly for management of osteoporosis.  She is tolerating fosamax without any side effects and has not missed any doses recently.  She was initially started on fosamax December 2021.  She has been taking a calcium and vitamin D supplement as recommended.  She denies any falls or fractures.  She remains active and has been exercising on a regular basis.  She has occasional pain in her right CMC joint and on the lateral aspect of her left knee.  She denies any joint swelling.  She has been using Voltaren gel topically as needed for symptomatic relief. She denies any new concerns.   Activities of Daily Living:  Patient reports morning stiffness for a few minutes.   Patient Reports nocturnal pain.  Difficulty dressing/grooming: Denies Difficulty climbing stairs: Denies Difficulty getting out of chair: Denies Difficulty using hands for taps, buttons, cutlery, and/or writing: Denies  Review of Systems  Constitutional:  Negative for fatigue.  HENT:  Negative for mouth sores, mouth dryness and nose dryness.   Eyes:  Positive for pain, itching and dryness.  Respiratory:  Negative for shortness of breath and difficulty breathing.   Cardiovascular:  Negative for chest pain and palpitations.  Gastrointestinal:  Negative for blood in stool, constipation and diarrhea.  Endocrine: Negative for increased urination.  Genitourinary:  Negative for difficulty urinating.  Musculoskeletal:  Positive for joint pain, joint pain, joint swelling, myalgias, morning stiffness, muscle  tenderness and myalgias.  Skin:  Negative for color change, rash and redness.  Allergic/Immunologic: Negative for susceptible to infections.  Neurological:  Positive for numbness. Negative for dizziness, headaches, memory loss and weakness.  Hematological:  Negative for bruising/bleeding tendency.  Psychiatric/Behavioral:  Negative for confusion.    PMFS History:  Patient Active Problem List   Diagnosis Date Noted   Paresthesia 03/01/2021   Primary osteoarthritis of both hands 11/10/2019   DDD (degenerative disc disease), lumbar 11/10/2019   Lumbar radiculopathy 04/30/2019   DDD (degenerative disc disease), cervical 08/07/2017   Coccygodynia 08/07/2017   Hx of migraines 07/18/2017   History of gastroesophageal reflux (GERD) 07/18/2017   History of depression 07/18/2017   Recurrent UTI 07/18/2017   Leukopenia 07/18/2017    Past Medical History:  Diagnosis Date   Arthritis    osteo   Chronic UTI    Depression    Frequent UTI    --sees Dr.McDiarmid   Headache    migraines went away with ment   Hypertrophy of uterus    Leukopenia 2016   WBC 3.8   Osteoporosis    Polyp of corpus uteri    Uterine prolapse     Family History  Problem Relation Age of Onset   Neuropathy Mother    Charcot-Marie-Tooth disease Mother    Hypertension Father    Osteoarthritis Father    Lupus Father    Thyroid disease Sister        removed    Healthy Son  Healthy Son    Healthy Son    Past Surgical History:  Procedure Laterality Date   CHOLECYSTECTOMY  1998   COLONOSCOPY     CYSTOSCOPY     LUMBAR LAMINECTOMY/ DECOMPRESSION WITH MET-RX Left 05/01/2019   Procedure: LUMBAR FOUR-FIVE LAMINECTOMY/ DECOMPRESSION WITH MET-RX;  Surgeon: Jadene Pierini, MD;  Location: [facility];  Service: Neurosurgery;  Laterality: Left;   RETINAL TEAR REPAIR CRYOTHERAPY Right 2021   TUBAL LIGATION  1994   Social History   Social History Narrative   Not on file   Immunization History  Administered Date(s)  Administered   PFIZER(Purple Top)SARS-COV-2 Vaccination 06/07/2019, 06/28/2019     Objective: Vital Signs: BP 111/69 (BP Location: Left Arm, Patient Position: Sitting, Cuff Size: Normal)   Pulse 65   Ht 5\' 7"  (1.702 m)   Wt 128 lb 9.6 oz (58.3 kg)   LMP 10/20/2014 (Exact Date)   BMI 20.14 kg/m    Physical Exam Vitals and nursing note reviewed.  Constitutional:      Appearance: She is well-developed.  HENT:     Head: Normocephalic and atraumatic.  Eyes:     Conjunctiva/sclera: Conjunctivae normal.  Cardiovascular:     Rate and Rhythm: Normal rate and regular rhythm.     Heart sounds: Normal heart sounds.  Pulmonary:     Effort: Pulmonary effort is normal.     Breath sounds: Normal breath sounds.  Abdominal:     General: Bowel sounds are normal.     Palpations: Abdomen is soft.  Musculoskeletal:     Cervical back: Normal range of motion.  Skin:    General: Skin is warm and dry.     Capillary Refill: Capillary refill takes less than 2 seconds.  Neurological:     Mental Status: She is alert and oriented to person, place, and time.  Psychiatric:        Behavior: Behavior normal.     Musculoskeletal Exam: C-spine has good range of motion with no discomfort.  Shoulder joints, elbow joints, wrist joints, MCPs, PIPs, DIPs have good range of motion with no synovitis.  Tenderness and prominence over the right CMC joint.  Some PIP and DIP thickening consistent with osteoarthritis of both hands.  Hip joints have good range of motion with some discomfort in the left hip.  Tenderness palpation over the left trochanteric bursa.  Knee joints have good range of motion with no warmth or effusion.  Tenderness at the distal insertion site of the left IT band noted.  Ankle joints have good range of motion with no tenderness or joint swelling.  No tenderness over MTP joints.  CDAI Exam: CDAI Score: -- Patient Global: --; Provider Global: -- Swollen: --; Tender: -- Joint Exam 06/28/2021   No  joint exam has been documented for this visit   There is currently no information documented on the homunculus. Go to the Rheumatology activity and complete the homunculus joint exam.  Investigation: No additional findings.  Imaging: No results found.  Recent Labs: Lab Results  Component Value Date   WBC 4.6 12/30/2020   HGB 13.0 12/30/2020   PLT 186 12/30/2020   NA 141 12/30/2020   K 4.4 12/30/2020   CL 106 12/30/2020   CO2 27 12/30/2020   GLUCOSE 96 12/30/2020   BUN 14 12/30/2020   CREATININE 0.72 12/30/2020   BILITOT 0.6 12/30/2020   ALKPHOS 56 05/01/2019   AST 26 12/30/2020   ALT 21 12/30/2020   PROT 6.5 12/30/2020  ALBUMIN 4.0 05/01/2019   CALCIUM 9.0 12/30/2020   GFRAA 109 06/30/2020    Speciality Comments: No specialty comments available.  Procedures:  No procedures performed Allergies: Patient has no known allergies.     Assessment / Plan:     Visit Diagnoses: Age-related osteoporosis without current pathological fracture - DEXA 11/25/2019 right femoral neck BMD 0.554 with T score -2.7.  She was given a prescription for Fosamax 70 mg 1 tablet by mouth once weekly in December 2021.  She has been tolerating Fosamax without any side effects and has not missed any doses recently.  She has also been taking over-the-counter vitamin D and calcium supplement.  Her vitamin D was 69 and calcium was 9.0 on 12/30/2020.  Renal function has remained within normal limits.  She is having updated lab work next month with her PCP.  Future orders for CBC, CMP, and vitamin D were placed today.   Emphasized the importance of taking calcium and vitamin D as well as performing resistive exercises.  She has not had any recent falls or fractures.  She remains active and has been exercising on a regular basis.  She will remain on Fosamax 70 mg 1 tablet once weekly until her updated bone density.  A future order for DEXA was placed today and will need to be scheduled in October 2023.  She  will follow-up in the office in 6 months and we will discuss the results at that time.   - Plan: VITAMIN D 25 Hydroxy (Vit-D Deficiency, Fractures), DG BONE DENSITY (DXA)  Vitamin D deficiency -Vitamin D was 69 on 12/30/2020.  She has been taking an over-the-counter vitamin D supplement.  Future order for vitamin D was placed today.  She plans on having updated lab work with her PCP next month.  Plan: VITAMIN D 25 Hydroxy (Vit-D Deficiency, Fractures)  Medication monitoring encounter - CBC and CMP WNL on 12/30/20.  Vitamin D was WNL-69 on 12/30/20.  She will be having updated lab work next month with her PCP.  Future orders for CBC, CMP, vitamin D level were placed today.  Plan: COMPLETE METABOLIC PANEL WITH GFR, CBC with Differential/Platelet  Primary osteoarthritis of both hands: She has PIP and DIP thickening consistent with osteoarthritis of both hands.  Right CMC joint thickening and tenderness noted.  Discussed the importance of joint protection and muscle strengthening.  She was encouraged to perform hand exercises.  I discussed the use of a CMC joint brace.  She plans on continuing to use Voltaren gel topically as needed for pain relief.  It band syndrome, left: She presents today with symptoms consistent with left IT band syndrome.  Her symptoms have been most severe at the distal insertion site on the lateral aspect of her left knee.  Her symptoms are exacerbated by crossing her legs or resting her foot on her knee.  She has been applying Voltaren gel topically as needed for symptomatic relief. On examination she has some tenderness palpation over the left trochanteric bursa and along the left IT band.  She will benefit from stretching exercises as well as the use of Voltaren gel.  DDD (degenerative disc disease), cervical: She has good ROM of the C-spine with no discomfort.  No symptoms of radiculopathy.   DDD (degenerative disc disease), lumbar - Lumbar spine discectomy April 2021 by Dr.  Venetia Constable. She continues to have chronic lower back pain and intermittent left sided sciatica.  She performs back stretching exercises daily.   Chronic SI joint  pain  Positive ANA (antinuclear antibody) - 10/12/19: AVISE index -2/4: 1:80H, anti-histone weak positive, ENA negative: No clinical features of systemic lupus.   Other medical conditions are listed as follows:   History of gastroesophageal reflux (GERD)  History of depression  Tinnitus of both ears  Other fatigue   Orders: Orders Placed This Encounter  Procedures   DG BONE DENSITY (DXA)   COMPLETE METABOLIC PANEL WITH GFR   CBC with Differential/Platelet   VITAMIN D 25 Hydroxy (Vit-D Deficiency, Fractures)   Meds ordered this encounter  Medications   alendronate (FOSAMAX) 70 MG tablet    Sig: TAKE 1 TABLET ONCE A WEEK. TAKE WITH A FULL GLASS OF WATER ON AN EMPTY STOMACH.    Dispense:  12 tablet    Refill:  0     Follow-Up Instructions: Return in about 6 months (around 12/28/2021) for Osteoporosis, Osteoarthritis, DDD.   [name], PA-C  Note - This record has been created using Dragon software.  Chart creation errors have been sought, but may not always  have been located. Such creation errors do not reflect on  the standard of medical care.

## 2021-06-28 ENCOUNTER — Encounter: Payer: Self-pay | Admitting: Physician Assistant

## 2021-06-28 ENCOUNTER — Ambulatory Visit (INDEPENDENT_AMBULATORY_CARE_PROVIDER_SITE_OTHER): Payer: 59 | Admitting: Physician Assistant

## 2021-06-28 VITALS — BP 111/69 | HR 65 | Ht 67.0 in | Wt 128.6 lb

## 2021-06-28 DIAGNOSIS — M19041 Primary osteoarthritis, right hand: Secondary | ICD-10-CM | POA: Diagnosis not present

## 2021-06-28 DIAGNOSIS — R768 Other specified abnormal immunological findings in serum: Secondary | ICD-10-CM

## 2021-06-28 DIAGNOSIS — R5383 Other fatigue: Secondary | ICD-10-CM

## 2021-06-28 DIAGNOSIS — M5030 Other cervical disc degeneration, unspecified cervical region: Secondary | ICD-10-CM

## 2021-06-28 DIAGNOSIS — Z8659 Personal history of other mental and behavioral disorders: Secondary | ICD-10-CM

## 2021-06-28 DIAGNOSIS — E559 Vitamin D deficiency, unspecified: Secondary | ICD-10-CM

## 2021-06-28 DIAGNOSIS — Z5181 Encounter for therapeutic drug level monitoring: Secondary | ICD-10-CM

## 2021-06-28 DIAGNOSIS — M19042 Primary osteoarthritis, left hand: Secondary | ICD-10-CM

## 2021-06-28 DIAGNOSIS — M5136 Other intervertebral disc degeneration, lumbar region: Secondary | ICD-10-CM

## 2021-06-28 DIAGNOSIS — H9313 Tinnitus, bilateral: Secondary | ICD-10-CM

## 2021-06-28 DIAGNOSIS — Z8719 Personal history of other diseases of the digestive system: Secondary | ICD-10-CM

## 2021-06-28 DIAGNOSIS — M810 Age-related osteoporosis without current pathological fracture: Secondary | ICD-10-CM | POA: Diagnosis not present

## 2021-06-28 DIAGNOSIS — G8929 Other chronic pain: Secondary | ICD-10-CM

## 2021-06-28 DIAGNOSIS — M533 Sacrococcygeal disorders, not elsewhere classified: Secondary | ICD-10-CM

## 2021-06-28 DIAGNOSIS — M7632 Iliotibial band syndrome, left leg: Secondary | ICD-10-CM

## 2021-06-28 MED ORDER — ALENDRONATE SODIUM 70 MG PO TABS: ORAL_TABLET | ORAL | 0 refills | Status: DC

## 2021-06-28 NOTE — Patient Instructions (Signed)
Standing Labs We placed an order today for your standing lab work.   Please have your standing labs drawn in 1 month   CBC, CMP, Vitamin D   If possible, please have your labs drawn 2 weeks prior to your appointment so that the provider can discuss your results at your appointment.  Please note that you may see your imaging and lab results in MyChart before we have reviewed them. We may be awaiting multiple results to interpret others before contacting you. Please allow our office up to 72 hours to thoroughly review all of the results before contacting the office for clarification of your results.  We have open lab daily: Monday through Thursday from 1:30-4:30 PM and Friday from 1:30-4:00 PM at the office of Dr. Pollyann Savoy, [facility].   Please be advised, all patients with office appointments requiring lab work will take precedent over walk-in lab work.  If possible, please come for your lab work on Monday and Friday afternoons, as you may experience shorter wait times. The office is located at [address], [address], [place], [place] [address] No appointment is necessary.   Labs are drawn by [facility]. Please bring your co-pay at the time of your lab draw.  You may receive a bill from [facility] for your lab work.  Please note if you are on Hydroxychloroquine and and an order has been placed for a Hydroxychloroquine level, you will need to have it drawn 4 hours or more after your last dose.  If you wish to have your labs drawn at another location, please call the office 24 hours in advance to send orders.  If you have any questions regarding directions or hours of operation,  please call [phone].   As a reminder, please drink plenty of water prior to coming for your lab work. Thanks!

## 2021-06-30 ENCOUNTER — Ambulatory Visit: Payer: 59 | Admitting: Physician Assistant

## 2021-07-05 ENCOUNTER — Telehealth: Payer: Self-pay | Admitting: Neurology

## 2021-07-05 NOTE — Telephone Encounter (Signed)
LVM and sent mychart msg informing pt of r/s needed for 8/1 appt- Dr. Terrace Arabia out.

## 2021-07-10 ENCOUNTER — Telehealth: Payer: Self-pay

## 2021-07-10 NOTE — Telephone Encounter (Signed)
Patient rescheduled her six month follow up to 09/11/21.

## 2021-07-10 NOTE — Telephone Encounter (Signed)
Patient left a voicemail this morning asking for a call to schedule an appointment with Dr. Terrace Arabia.

## 2021-07-27 ENCOUNTER — Other Ambulatory Visit: Payer: Self-pay | Admitting: Family Medicine

## 2021-07-27 DIAGNOSIS — M545 Low back pain, unspecified: Secondary | ICD-10-CM

## 2021-07-27 DIAGNOSIS — R202 Paresthesia of skin: Secondary | ICD-10-CM

## 2021-08-18 ENCOUNTER — Ambulatory Visit
Admission: RE | Admit: 2021-08-18 | Discharge: 2021-08-18 | Disposition: A | Discharge status: Home / Self Care | Payer: 59 | Source: Ambulatory Visit | Attending: Family Medicine | Admitting: Family Medicine

## 2021-08-18 DIAGNOSIS — M545 Low back pain, unspecified: Secondary | ICD-10-CM

## 2021-08-18 DIAGNOSIS — R202 Paresthesia of skin: Secondary | ICD-10-CM

## 2021-08-18 MED ORDER — GADOBENATE DIMEGLUMINE 529 MG/ML IV SOLN
11.0000 mL | Freq: Once | INTRAVENOUS | Status: AC | PRN
  Administered 2021-08-18: 11 mL via INTRAVENOUS

## 2021-08-29 ENCOUNTER — Ambulatory Visit: Payer: 59 | Admitting: Neurology

## 2021-09-11 ENCOUNTER — Encounter: Payer: Self-pay | Admitting: Neurology

## 2021-09-11 ENCOUNTER — Ambulatory Visit (INDEPENDENT_AMBULATORY_CARE_PROVIDER_SITE_OTHER): Payer: 59 | Admitting: Neurology

## 2021-09-11 VITALS — BP 106/62 | HR 75 | Ht 67.0 in | Wt 128.0 lb

## 2021-09-11 DIAGNOSIS — G2581 Restless legs syndrome: Secondary | ICD-10-CM

## 2021-09-11 DIAGNOSIS — M5416 Radiculopathy, lumbar region: Secondary | ICD-10-CM | POA: Diagnosis not present

## 2021-09-11 DIAGNOSIS — R202 Paresthesia of skin: Secondary | ICD-10-CM

## 2021-09-11 MED ORDER — CITALOPRAM HYDROBROMIDE 10 MG PO TABS
20.0000 mg | ORAL_TABLET | Freq: Every day | ORAL | 11 refills | Status: DC
Start: 2021-09-11 — End: 2021-10-10

## 2021-09-11 MED ORDER — GABAPENTIN 100 MG PO CAPS: 200.0000 mg | ORAL_CAPSULE | Freq: Three times a day (TID) | ORAL | 11 refills | Status: DC

## 2021-09-11 NOTE — Progress Notes (Unsigned)
Chief Complaint  Patient presents with   Follow-up    Rm 14. Alone. C/o increased symptoms of nerve pain in left leg and left hip. Staying stable in right leg. C/o tingling in right occipital area of the head that occurs at night, last a few minutes, then dissipating.      ASSESSMENT AND PLAN  Maureen Davis is a 61 y.o. female   Bilateral lower extremity paresthesia,  History suggestive of restless leg syndrome,  In the setting of anxiety,  Increase Celexa to 20 mg daily, may also increase gabapentin up to 100 mg 6 tablets daily, may take higher dose at nighttime for better sleep quality  Check ferritin level   DIAGNOSTIC DATA (LABS, IMAGING, TESTING) - I reviewed patient records, labs, notes, testing and imaging myself where available. 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  Reported maternal grandfather also suffered disease,   MEDICAL HISTORY:  Maureen Davis, seen in request by   [facility].       I reviewed and summarized the referring note. PMHX Osteopenia Depression, anxiety Lumbar decompression surgery in April 2021.  She has water ski bend over, low back pain, during COVID,  going down left leg, left foot weakness. Surgery did help her pain,   2nd surgery in 2 months, low back pain, left side, blakc out with pain,   Allreviate the pain, back is fine,  could not use her left leg, in rehab   Still has mild residue left side weakness.  Numbnss at right toes gradual onset since 2 years ago,  stay the same,   Left side is gettign worse, left toes getting ewakner, constant left ankel pain,  left hip, hard to sit at her left hip.  She was doing slame,w ent to people's home, window  tretament, tripping doing up stairs, she now sit, 2 hours, left hip, region, getting walks sciatic 10, mintues then getting better.  She lies down, eveyrthings come alive, all the tingiogns and pricks in her left legs, up and down, mainly left side,  heating pad, gabapentin, sleeps, wakes her up.  Change positions, try to walk,       Past Medical History:  Diagnosis Date   Arthritis    osteo   Chronic UTI    Depression    Frequent UTI    --sees Dr.McDiarmid   Headache    migraines went away with ment   Hypertrophy of uterus    Leukopenia 2016   WBC 3.8   Osteoporosis    Polyp of corpus uteri    Uterine prolapse    Past Surgical History:  Procedure Laterality Date   CHOLECYSTECTOMY  1998   COLONOSCOPY     CYSTOSCOPY     LUMBAR LAMINECTOMY/ DECOMPRESSION WITH MET-RX Left 05/01/2019   Procedure: LUMBAR FOUR-FIVE LAMINECTOMY/ DECOMPRESSION WITH MET-RX;  Surgeon: Jadene Pierini, MD;  Location: [facility] OR;  Service: Neurosurgery;  Laterality: Left;   RETINAL TEAR REPAIR CRYOTHERAPY Right 2021   TUBAL LIGATION  1994     PHYSICAL EXAM:   Vitals:   09/11/21 0932  BP: 106/62  Pulse: 75  Weight: 128 lb (58.1 kg)  Height: 5\' 7"  (1.702 m)   Not recorded     Body mass index is 20.05 kg/m.  PHYSICAL EXAMNIATION:  Gen: NAD, conversant, well nourised, well groomed                     Cardiovascular: Regular rate rhythm, no peripheral edema, warm, nontender. Eyes: Conjunctivae clear without exudates or hemorrhage Neck: Supple, no carotid bruits. Pulmonary: Clear to auscultation bilaterally   NEUROLOGICAL EXAM:  MENTAL STATUS: Speech/cognition: Awake, alert, oriented to history taking and casual conversation CRANIAL NERVES: CN II: Visual fields are full to confrontation. Pupils are round equal and briskly reactive to light. CN III, IV, VI: extraocular movement are normal. No ptosis. CN V: Facial sensation is intact to light touch CN VII: Face is symmetric with  normal eye closure  CN VIII: Hearing is normal to causal conversation. CN IX, X: Phonation is normal. CN XI: Head turning and shoulder shrug are intact  MOTOR: There is no pronator drift of out-stretched arms. Muscle bulk and tone are normal. Muscle strength is normal.  REFLEXES: Reflexes are 2+ and symmetric at the biceps, triceps, knees, and ankles. Plantar responses are flexor.  SENSORY: Intact to light touch, pinprick and vibratory sensation are intact in fingers and toes.  COORDINATION: There is no trunk or limb dysmetria noted.  GAIT/STANCE: Posture is normal. Gait is steady with normal steps, base, arm swing, and turning. Heel and toe walking are normal. Tandem gait is normal.  Romberg is absent.  REVIEW OF SYSTEMS:  Full 14 system review of systems performed and notable only for as above All other review of systems were negative.   ALLERGIES: No Known Allergies  HOME MEDICATIONS: Current Outpatient Medications  Medication Sig Dispense Refill   alendronate (FOSAMAX) 70 MG tablet TAKE 1 TABLET ONCE A WEEK. TAKE WITH A FULL GLASS OF WATER ON AN EMPTY STOMACH. 12 tablet 0   carisoprodol (SOMA) 350 MG tablet Take 350 mg by mouth as needed for muscle spasms.     citalopram (CELEXA) 10 MG tablet Take by mouth.     estradiol (ESTRACE) 0.1 MG/GM vaginal cream Use 1/2 g vaginally two or three times per week as needed to maintain symptom relief. 42.5 g 2   gabapentin (NEURONTIN) 100 MG capsule Take 2 capsules (200 mg total) by mouth at bedtime. 180 capsule 1   No current facility-administered medications for this visit.    PAST MEDICAL HISTORY: Past Medical History:  Diagnosis Date   Arthritis    osteo   Chronic UTI    Depression    Frequent UTI    --sees Dr.McDiarmid   Headache    migraines went away with ment   Hypertrophy of uterus    Leukopenia 2016   WBC 3.8   Osteoporosis    Polyp of corpus uteri    Uterine prolapse     PAST SURGICAL HISTORY: Past  Surgical History:  Procedure Laterality Date   CHOLECYSTECTOMY  1998   COLONOSCOPY     CYSTOSCOPY     LUMBAR LAMINECTOMY/ DECOMPRESSION WITH MET-RX Left 05/01/2019   Procedure: LUMBAR FOUR-FIVE LAMINECTOMY/ DECOMPRESSION WITH MET-RX;  Surgeon: [name], MD;  Location: [place];  Service: Neurosurgery;  Laterality: Left;   RETINAL TEAR REPAIR CRYOTHERAPY Right 2021   TUBAL LIGATION  1994    FAMILY HISTORY: Family History  Problem Relation Age of Onset   Neuropathy Mother    Charcot-Marie-Tooth disease Mother    Hypertension Father    Osteoarthritis Father    Lupus Father    Thyroid disease Sister  removed    Healthy Son    Healthy Son    Healthy Son     SOCIAL HISTORY: Social History   Socioeconomic History   Marital status: Married    Spouse name: Not on file   Number of children: Not on file   Years of education: Not on file   Highest education level: Not on file  Occupational History   Not on file  Tobacco Use   Smoking status: Never    Passive exposure: Never   Smokeless tobacco: Never  Vaping Use   Vaping Use: Never used  Substance and Sexual Activity   Alcohol use: Not Currently    Comment: occ   Drug use: Never   Sexual activity: Yes    Partners: Male    Birth control/protection: Surgical    Comment: Tubal Ligation  Other Topics Concern   Not on file  Social History Narrative   Not on file   Social Determinants of Health   Financial Resource Strain: Not on file  Food Insecurity: Not on file  Transportation Needs: Not on file  Physical Activity: Not on file  Stress: Not on file  Social Connections: Not on file  Intimate Partner Violence: Not on file      Marcial Pacas, M.D. Ph.D.  [facility] [address] [place], [place] [address] Ph: [phone] Fax: [phone]  CC:  Jonathon Jordan, MD [place],  [place] [address]  Jonathon Jordan, MD

## 2021-09-12 LAB — VITAMIN B12: Vitamin B-12: 482 pg/mL (ref 232–1245)

## 2021-09-12 LAB — IRON,TIBC AND FERRITIN PANEL
Ferritin: 91 ng/mL (ref 15–150)
Iron Saturation: 24 % (ref 15–55)
Iron: 69 ug/dL (ref 27–159)
Total Iron Binding Capacity: 288 ug/dL (ref 250–450)
UIBC: 219 ug/dL (ref 131–425)

## 2021-10-06 ENCOUNTER — Other Ambulatory Visit: Payer: Self-pay | Admitting: Neurology

## 2021-10-09 ENCOUNTER — Other Ambulatory Visit: Payer: Self-pay | Admitting: Physician Assistant

## 2021-10-23 ENCOUNTER — Telehealth: Payer: Self-pay | Admitting: Rheumatology

## 2021-10-23 ENCOUNTER — Other Ambulatory Visit: Payer: Self-pay | Admitting: Physician Assistant

## 2021-10-23 NOTE — Telephone Encounter (Signed)
Next Visit: 01/02/2022  Last Visit: 06/28/2021  Last Fill: 06/28/2021  DX: Age-related osteoporosis without current pathological fracture   Current Dose per office note 06/28/2021: Fosamax 70 mg 1 tablet once weekly   Labs: 12/30/2020 CBC, CMP and vitamin D are within normal limits.  Left message to advise patient she is due to update lab work.   Okay to refill Fosamax?

## 2021-10-23 NOTE — Telephone Encounter (Signed)
Patient called stating she was returning Andrea's call regarding her labwork being due.  Patient states she had labwork with her PCP Dr. Stephanie Acre on 08/16/21.

## 2021-10-23 NOTE — Telephone Encounter (Signed)
LMOM for patient to have labs faxed from PCP office

## 2021-11-14 ENCOUNTER — Other Ambulatory Visit: Payer: Self-pay | Admitting: Rheumatology

## 2021-11-27 ENCOUNTER — Encounter: Payer: Self-pay | Admitting: Obstetrics and Gynecology

## 2021-11-29 ENCOUNTER — Encounter: Payer: Self-pay | Admitting: Obstetrics and Gynecology

## 2021-12-01 ENCOUNTER — Telehealth: Payer: Self-pay | Admitting: *Deleted

## 2021-12-01 NOTE — Telephone Encounter (Signed)
Received DEXA results from [facility].  Date of Scan: 11/27/2021  Lowest T-score:-2.8  BMD:0.533  Lowest site measured:Right Femoral Neck  DX: Osteoporosis   Significant changes in BMD and site measured (5% and above):4 % Left total femur  Current Regimen:Fosamax, Calcium and Vitamin D  Recommendation:Discuss at follow up visit.   Reviewed by:Dr. Bo Merino   Next Appointment:  01/02/2022

## 2021-12-19 NOTE — Progress Notes (Signed)
Office Visit Note  Patient: Maureen Davis             Date of Birth: 1960-03-30           MRN: [id number]             PCP: Jonathon Jordan, MD Referring: Jonathon Jordan, MD Visit Date: 01/02/2022 Occupation: _0 @  Subjective:  Discuss DEXA results and right thumb pain  History of Present Illness: Maureen Davis is a 61 y.o. female with history of osteoarthritis and degenerative disc disease and osteoporosis.  She states she continues to have pain and discomfort in her right CMC joint.  Stiffness in her bilateral hands.  Her left IT band symptoms improved after physical therapy.  Continues to have some stiffness in her cervical and lumbar region.  She has been taking Fosamax 70 mg p.o. weekly.  She states it is hard for her to take calcium on a regular basis due to constipation.  She takes vitamin D on a regular basis.  Activities of Daily Living:  Patient reports morning stiffness for 10 minutes.   Patient Denies nocturnal pain.  Difficulty dressing/grooming: Denies Difficulty climbing stairs: Denies Difficulty getting out of chair: Denies Difficulty using hands for taps, buttons, cutlery, and/or writing: Denies  Review of Systems  Constitutional:  Negative for fatigue.  HENT:  Negative for mouth sores and mouth dryness.   Eyes:  Positive for dryness.  Respiratory:  Negative for shortness of breath.   Cardiovascular:  Negative for chest pain and palpitations.  Gastrointestinal:  Negative for blood in stool, constipation and diarrhea.  Endocrine: Negative for increased urination.  Genitourinary:  Negative for difficulty urinating.  Musculoskeletal:  Positive for joint pain, joint pain, joint swelling, myalgias, morning stiffness, muscle tenderness and myalgias. Negative for gait problem and muscle weakness.  Skin:  Negative for color change, rash, hair loss and sensitivity to sunlight.  Allergic/Immunologic: Negative for susceptible to infections.  Neurological:   Negative for dizziness and headaches.  Hematological:  Negative for swollen glands.  Psychiatric/Behavioral:  Negative for depressed mood and sleep disturbance. The patient is not nervous/anxious.     PMFS History:  Patient Active Problem List   Diagnosis Date Noted   Restless leg 09/11/2021   Paresthesia 03/01/2021   Primary osteoarthritis of both hands 11/10/2019   DDD (degenerative disc disease), lumbar 11/10/2019   Lumbar radiculopathy 04/30/2019   DDD (degenerative disc disease), cervical 08/07/2017   Coccygodynia 08/07/2017   Hx of migraines 07/18/2017   History of gastroesophageal reflux (GERD) 07/18/2017   History of depression 07/18/2017   Recurrent UTI 07/18/2017   Leukopenia 07/18/2017    Past Medical History:  Diagnosis Date   Arthritis    osteo   Chronic UTI    Depression    Frequent UTI    --sees Dr.McDiarmid   Headache    migraines went away with ment   Hypertrophy of uterus    Leukopenia 2016   WBC 3.8   Osteoporosis    Polyp of corpus uteri    Uterine prolapse     Family History  Problem Relation Age of Onset   Neuropathy Mother    Charcot-Marie-Tooth disease Mother    Hypertension Father    Osteoarthritis Father    Lupus Father    Thyroid disease Sister        removed    Healthy Son    Healthy Son    Healthy Son    Past Surgical History:  Procedure Laterality  Date   CHOLECYSTECTOMY  1998   COLONOSCOPY     CYSTOSCOPY     LUMBAR LAMINECTOMY/ DECOMPRESSION WITH MET-RX Left 05/01/2019   Procedure: LUMBAR FOUR-FIVE LAMINECTOMY/ DECOMPRESSION WITH MET-RX;  Surgeon: [name], MD;  Location: [facility];  Service: Neurosurgery;  Laterality: Left;   RETINAL TEAR REPAIR CRYOTHERAPY Right 2021   TUBAL LIGATION  1994   Social History   Social History Narrative   Not on file   Immunization History  Administered Date(s) Administered   PFIZER(Purple Top)SARS-COV-2 Vaccination 06/07/2019, 06/28/2019     Objective: Vital Signs: BP 113/68 (BP  Location: Left Arm, Patient Position: Sitting, Cuff Size: Normal)   Pulse 68   Resp 15   Ht _0  (1.702 m)   Wt 128 lb (58.1 kg)   LMP 10/20/2014 (Exact Date)   BMI 20.05 kg/m    Physical Exam Vitals and nursing note reviewed.  Constitutional:      Appearance: She is well-developed.  HENT:     Head: Normocephalic and atraumatic.  Eyes:     Conjunctiva/sclera: Conjunctivae normal.  Cardiovascular:     Rate and Rhythm: Normal rate and regular rhythm.     Heart sounds: Normal heart sounds.  Pulmonary:     Effort: Pulmonary effort is normal.     Breath sounds: Normal breath sounds.  Abdominal:     General: Bowel sounds are normal.     Palpations: Abdomen is soft.  Musculoskeletal:     Cervical back: Normal range of motion.  Lymphadenopathy:     Cervical: No cervical adenopathy.  Skin:    General: Skin is warm and dry.     Capillary Refill: Capillary refill takes less than 2 seconds.  Neurological:     Mental Status: She is alert and oriented to person, place, and time.  Psychiatric:        Behavior: Behavior normal.      Musculoskeletal Exam: She had limited range of motion of the cervical spine.  She had good range of motion of the lumbar spine.  Shoulder joints, elbow joints, wrist joints with good range of motion.  She had bilateral CMC PIP and DIP thickening.  She had tenderness over right CMC joint.  No synovitis was noted.  Hip joints and knee joints in good range of motion.  She had no tenderness over ankles or MTPs.  CDAI Exam: CDAI Score: -- Patient Global: --; Provider Global: -- Swollen: --; Tender: -- Joint Exam 01/02/2022   No joint exam has been documented for this visit   There is currently no information documented on the homunculus. Go to the Rheumatology activity and complete the homunculus joint exam.  Investigation: No additional findings.  Imaging: No results found.  Recent Labs: Lab Results  Component Value Date   WBC 4.6 12/30/2020    HGB 13.0 12/30/2020   PLT 186 12/30/2020   NA 141 12/30/2020   K 4.4 12/30/2020   CL 106 12/30/2020   CO2 27 12/30/2020   GLUCOSE 96 12/30/2020   BUN 14 12/30/2020   CREATININE 0.72 12/30/2020   BILITOT 0.6 12/30/2020   ALKPHOS 56 05/01/2019   AST 26 12/30/2020   ALT 21 12/30/2020   PROT 6.5 12/30/2020   ALBUMIN 4.0 05/01/2019   CALCIUM 9.0 12/30/2020   GFRAA 109 06/30/2020      Speciality Comments: No specialty comments available.  Procedures:  No procedures performed Allergies: Patient has no known allergies.   Assessment / Plan:  Visit Diagnoses: Age-related osteoporosis without current pathological fracture - November 27, 2021 DEXA scan T score -2.8, BMD 0.533 right femoral neck, no comparison.  Percentage change in AP spine 3%, percent change in left total femur 4%.DEXA 11/25/2019 right femoral neck BMD 0.554 with T score -2.7.  Fosamax 70 mg 1 tablet by mouth once weekly since October 2021.  I did detailed discussion with the patient that she did not have any significant improvement and may be some deterioration in her BMD.  I discussed the option of IV Reclast.  Indications side effects contraindications including the increased risk of osteonecrosis of the jaw and atypical femoral fracture were discussed.  A handout was given for her review.  Patient wants to proceed with IV Reclast.  We will apply for IV Reclast.  Once approved she will discontinue Fosamax and switch to IV Reclast.  Patient states she had labs done with her PCP and she will forward the labs to [place].  Vitamin D deficiency-she has been taking over-the-counter vitamin D.  Patient had recent vitamin D levels done and she will forward the results to [place].  Medication management-she will forward CBC and CMP results to [place].  Primary osteoarthritis of both hands-she has severe osteoarthritis in her bilateral hands with CMC PIP and DIP thickening.  She has been having pain and discomfort in her right CMC joint.  She has  been using Voltaren gel without much relief.  A prescription for [place] brace was given.  Joint protection muscle strengthening was discussed.  A handout on hand exercises was given.  It band syndrome, left-symptoms improved after physical therapy.  IT band stretches were discussed.  DDD (degenerative disc disease), cervical-she had limited range of motion with minimal discomfort.  DDD (degenerative disc disease), lumbar - Lumbar spine discectomy April 2021 by Dr. Venetia Constable.  She has done well with the lumbar spine surgery.  She has intermittent tingling in her feet related to lumbar spine surgery.  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  Positive ANA (antinuclear antibody) - 10/12/19: AVISE index -2/4: 1:80H, anti-histone weak positive, ENA negative: No clinical features of systemic lupus.  History of gastroesophageal reflux (GERD)-currently not symptomatic.  Tinnitus of both ears  Other fatigue  History of depression  Orders: No orders of the defined types were placed in this encounter.  Meds ordered this encounter  Medications   alendronate (FOSAMAX) 70 MG tablet    Sig: Take with a full glass of water on an empty stomach.    Dispense:  12 tablet    Refill:  0     Follow-Up Instructions: Return in about 3 months (around 04/03/2022) for Osteoarthritis, Osteoporosis.   Bo Merino, MD  Note - This record has been created using Editor, commissioning.  Chart creation errors have been sought, but may not always  have been located. Such creation errors do not reflect on  the standard of medical care.

## 2022-01-02 ENCOUNTER — Encounter: Payer: Self-pay | Admitting: Rheumatology

## 2022-01-02 ENCOUNTER — Ambulatory Visit: Payer: 59 | Attending: Rheumatology | Admitting: Rheumatology

## 2022-01-02 VITALS — BP 113/68 | HR 68 | Resp 15 | Ht 67.0 in | Wt 128.0 lb

## 2022-01-02 DIAGNOSIS — M19041 Primary osteoarthritis, right hand: Secondary | ICD-10-CM

## 2022-01-02 DIAGNOSIS — M533 Sacrococcygeal disorders, not elsewhere classified: Secondary | ICD-10-CM

## 2022-01-02 DIAGNOSIS — M5030 Other cervical disc degeneration, unspecified cervical region: Secondary | ICD-10-CM

## 2022-01-02 DIAGNOSIS — M810 Age-related osteoporosis without current pathological fracture: Secondary | ICD-10-CM

## 2022-01-02 DIAGNOSIS — R768 Other specified abnormal immunological findings in serum: Secondary | ICD-10-CM

## 2022-01-02 DIAGNOSIS — M7632 Iliotibial band syndrome, left leg: Secondary | ICD-10-CM

## 2022-01-02 DIAGNOSIS — E559 Vitamin D deficiency, unspecified: Secondary | ICD-10-CM | POA: Diagnosis not present

## 2022-01-02 DIAGNOSIS — Z8659 Personal history of other mental and behavioral disorders: Secondary | ICD-10-CM

## 2022-01-02 DIAGNOSIS — M5136 Other intervertebral disc degeneration, lumbar region: Secondary | ICD-10-CM

## 2022-01-02 DIAGNOSIS — M19042 Primary osteoarthritis, left hand: Secondary | ICD-10-CM

## 2022-01-02 DIAGNOSIS — Z79899 Other long term (current) drug therapy: Secondary | ICD-10-CM

## 2022-01-02 DIAGNOSIS — R5383 Other fatigue: Secondary | ICD-10-CM

## 2022-01-02 DIAGNOSIS — H9313 Tinnitus, bilateral: Secondary | ICD-10-CM

## 2022-01-02 DIAGNOSIS — Z8719 Personal history of other diseases of the digestive system: Secondary | ICD-10-CM

## 2022-01-02 DIAGNOSIS — G8929 Other chronic pain: Secondary | ICD-10-CM

## 2022-01-02 MED ORDER — ALENDRONATE SODIUM 70 MG PO TABS: ORAL_TABLET | ORAL | 0 refills | Status: DC

## 2022-01-02 NOTE — Patient Instructions (Addendum)
Zoledronic Acid Injection (Bone Disorders) What is this medication? ZOLEDRONIC ACID (ZOE le dron ik AS id) prevents and treats osteoporosis. It may also be used to treat Paget's disease of the bone. It works by making your bones stronger and less likely to break (fracture). It belongs to a group of medications called bisphosphonates. This medicine may be used for other purposes; ask your health care provider or pharmacist if you have questions. COMMON BRAND NAME(S): Reclast What should I tell my care team before I take this medication? They need to know if you have any of these conditions: Bleeding disorder Cancer Dental disease Kidney disease Low levels of calcium in the blood Low red blood cell counts Lung or breathing disease, such as asthma Receiving steroids, such as dexamethasone or prednisone An unusual or allergic reaction to zoledronic acid, other medications, foods, dyes, or preservatives Pregnant or trying to get pregnant Breast-feeding How should I use this medication? This medication is injected into a vein. It is given by your care team in a hospital or clinic setting. A special MedGuide will be given to you before each treatment. Be sure to read this information carefully each time. Talk to your care team about the use of this medication in children. Special care may be needed. Overdosage: If you think you have taken too much of this medicine contact a poison control center or emergency room at once. NOTE: This medicine is only for you. Do not share this medicine with others. What if I miss a dose? Keep appointments for follow-up doses. It is important not to miss your dose. Call your care team if you are unable to keep an appointment. What may interact with this medication? Certain antibiotics given by injection Medications for pain and inflammation, such as ibuprofen, naproxen, NSAIDs Some diuretics, such as bumetanide, furosemide Teriparatide This list may not  describe all possible interactions. Give your health care provider a list of all the medicines, herbs, non-prescription drugs, or dietary supplements you use. Also tell them if you smoke, drink alcohol, or use illegal drugs. Some items may interact with your medicine. What should I watch for while using this medication? Visit your care team for regular checks on your progress. It may be some time before you see the benefit from this medication. Some people who take this medication have severe bone, joint, or muscle pain. This medication may also increase your risk for jaw problems or a broken thigh bone. Tell your care team right away if you have severe pain in your jaw, bones, joints, or muscles. Tell your care team if you have any pain that does not go away or that gets worse. You should make sure you get enough calcium and vitamin D while you are taking this medication. Discuss the foods you eat and the vitamins you take with your care team. You may need bloodwork while taking this medication. Tell your dentist and dental surgeon that you are taking this medication. You should not have major dental surgery while on this medication. See your dentist to have a dental exam and fix any dental problems before starting this medication. Take good care of your teeth while on this medication. Make sure you see your dentist for regular follow-up appointments. What side effects may I notice from receiving this medication? Side effects that you should report to your care team as soon as possible: Allergic reactions--skin rash, itching, hives, swelling of the face, lips, tongue, or throat Kidney injury--decrease in the amount of urine,   swelling of the ankles, hands, or feet Low calcium level--muscle pain or cramps, confusion, tingling, or numbness in the hands or feet Osteonecrosis of the jaw--pain, swelling, or redness in the mouth, numbness of the jaw, poor healing after dental work, unusual discharge from the  mouth, visible bones in the mouth Severe bone, joint, or muscle pain Side effects that usually do not require medical attention (report to your care team if they continue or are bothersome): Diarrhea Dizziness Headache Nausea Stomach pain Vomiting This list may not describe all possible side effects. Call your doctor for medical advice about side effects. You may report side effects to FDA at 1-800-FDA-1088. Where should I keep my medication? This medication is given in a hospital or clinic. It will not be stored at home. NOTE: This sheet is a summary. It may not cover all possible information. If you have questions about this medicine, talk to your doctor, pharmacist, or health care provider.  2023 Elsevier/Gold Standard (2021-03-03 00:00:00)  Hand Exercises Hand exercises can be helpful for almost anyone. These exercises can strengthen the hands, improve flexibility and movement, and increase blood flow to the hands. These results can make work and daily tasks easier. Hand exercises can be especially helpful for people who have joint pain from arthritis or have nerve damage from overuse (carpal tunnel syndrome). These exercises can also help people who have injured a hand. Exercises Most of these hand exercises are gentle stretching and motion exercises. It is usually safe to do them often throughout the day. Warming up your hands before exercise may help to reduce stiffness. You can do this with gentle massage or by placing your hands in warm water for 10-15 minutes. It is normal to feel some stretching, pulling, tightness, or mild discomfort as you begin new exercises. This will gradually improve. Stop an exercise right away if you feel sudden, severe pain or your pain gets worse. Ask your health care provider which exercises are best for you. Knuckle bend or "claw" fist  Stand or sit with your arm, hand, and all five fingers pointed straight up. Make sure to keep your wrist straight  during the exercise. Gently bend your fingers down toward your palm until the tips of your fingers are touching the top of your palm. Keep your big knuckle straight and just bend the small knuckles in your fingers. Hold this position for __________ seconds. Straighten (extend) your fingers back to the starting position. Repeat this exercise 5-10 times with each hand. Full finger fist  Stand or sit with your arm, hand, and all five fingers pointed straight up. Make sure to keep your wrist straight during the exercise. Gently bend your fingers into your palm until the tips of your fingers are touching the middle of your palm. Hold this position for __________ seconds. Extend your fingers back to the starting position, stretching every joint fully. Repeat this exercise 5-10 times with each hand. Straight fist Stand or sit with your arm, hand, and all five fingers pointed straight up. Make sure to keep your wrist straight during the exercise. Gently bend your fingers at the big knuckle, where your fingers meet your hand, and the middle knuckle. Keep the knuckle at the tips of your fingers straight and try to touch the bottom of your palm. Hold this position for __________ seconds. Extend your fingers back to the starting position, stretching every joint fully. Repeat this exercise 5-10 times with each hand. Tabletop  Stand or sit with your arm,  hand, and all five fingers pointed straight up. Make sure to keep your wrist straight during the exercise. Gently bend your fingers at the big knuckle, where your fingers meet your hand, as far down as you can while keeping the small knuckles in your fingers straight. Think of forming a tabletop with your fingers. Hold this position for __________ seconds. Extend your fingers back to the starting position, stretching every joint fully. Repeat this exercise 5-10 times with each hand. Finger spread  Place your hand flat on a table with your palm facing  down. Make sure your wrist stays straight as you do this exercise. Spread your fingers and thumb apart from each other as far as you can until you feel a gentle stretch. Hold this position for __________ seconds. Bring your fingers and thumb tight together again. Hold this position for __________ seconds. Repeat this exercise 5-10 times with each hand. Making circles  Stand or sit with your arm, hand, and all five fingers pointed straight up. Make sure to keep your wrist straight during the exercise. Make a circle by touching the tip of your thumb to the tip of your index finger. Hold for __________ seconds. Then open your hand wide. Repeat this motion with your thumb and each finger on your hand. Repeat this exercise 5-10 times with each hand. Thumb motion  Sit with your forearm resting on a table and your wrist straight. Your thumb should be facing up toward the ceiling. Keep your fingers relaxed as you move your thumb. Lift your thumb up as high as you can toward the ceiling. Hold for __________ seconds. Bend your thumb across your palm as far as you can, reaching the tip of your thumb for the small finger (pinkie) side of your palm. Hold for __________ seconds. Repeat this exercise 5-10 times with each hand. Grip strengthening  Hold a stress ball or other soft ball in the middle of your hand. Slowly increase the pressure, squeezing the ball as much as you can without causing pain. Think of bringing the tips of your fingers into the middle of your palm. All of your finger joints should bend when doing this exercise. Hold your squeeze for __________ seconds, then relax. Repeat this exercise 5-10 times with each hand. Contact a health care provider if: Your hand pain or discomfort gets much worse when you do an exercise. Your hand pain or discomfort does not improve within 2 hours after you exercise. If you have any of these problems, stop doing these exercises right away. Do not do them  again unless your health care provider says that you can. Get help right away if: You develop sudden, severe hand pain or swelling. If this happens, stop doing these exercises right away. Do not do them again unless your health care provider says that you can. This information is not intended to replace advice given to you by your health care provider. Make sure you discuss any questions you have with your health care provider. Document Revised: 05/05/2020 Document Reviewed: 05/05/2020 Elsevier Patient Education  [place].

## 2022-01-03 ENCOUNTER — Telehealth: Payer: Self-pay | Admitting: Pharmacist

## 2022-01-03 NOTE — Telephone Encounter (Addendum)
Reclast BIV is pending labs from PCP. Patient has a Oncologist so will place referral to [facility] infusion center once labs are received.  Chesley Mires, PharmD, MPH, BCPS, CPP Clinical Pharmacist (Rheumatology and Pulmonology)  ----- Message from Ellen Henri, CMA sent at 01/02/2022  2:49 PM EST ----- Please apply for Reclast IV per Dr. Corliss Skains. Thanks!

## 2022-01-18 ENCOUNTER — Telehealth: Payer: Self-pay | Admitting: *Deleted

## 2022-01-18 ENCOUNTER — Encounter: Payer: Self-pay | Admitting: *Deleted

## 2022-01-18 NOTE — Telephone Encounter (Signed)
Labs received from:Dr. Mila Palmer  Drawn on:08/20/2021  Reviewed by:Sherron Ales, PA-C  Labs drawn:Hgb A1C Lipid Panel, TSH, CMP, CBC  Results:Hgb A1C 5.8  Cholesterol 209  HDLD 99  LDL Chol 100  WBC 3.3  RBC 4.19  Patient is on Fosamax 70 mg once weekly.

## 2022-03-16 NOTE — Telephone Encounter (Signed)
Patient due for updated labs to move forward with Reclast. MyChart message sent  Knox Saliva, PharmD, MPH, BCPS, CPP Clinical Pharmacist (Rheumatology and Pulmonology)

## 2022-03-19 ENCOUNTER — Ambulatory Visit: Payer: 59 | Admitting: Neurology

## 2022-03-28 NOTE — Progress Notes (Unsigned)
Office Visit Note  Patient: Maureen Davis             Date of Birth: 1960/06/11           MRN: [id number]             PCP: Jonathon Jordan, MD Referring: Jonathon Jordan, MD Visit Date: 04/11/2022 Occupation: @GUAROCC @  Subjective:  Discuss initiating Reclast  History of Present Illness: Maureen Davis is a 62 y.o. female with history of osteoporosis and osteoarthritis.  She is taking fosamax 70 mg 1 tablet by mouth once weekly for management of osteoporosis.    She has been tolerating Fosamax without any side effects.  Her last dose of Fosamax was taken last Monday.  Patient reports that she has increased her dietary calcium intake and has also been taking calcical and vitamin D 1000 units daily.  She denies any recent falls or fractures.  She presents today to further discuss switching to IV Reclast.  Patient reports that she has done some of her own research about Reclast online and has several questions and concerns.  Activities of Daily Living:  Patient reports morning stiffness for 5-10 minutes.   Patient Reports nocturnal pain.  Difficulty dressing/grooming: Denies Difficulty climbing stairs: Denies Difficulty getting out of chair: Denies Difficulty using hands for taps, buttons, cutlery, and/or writing: Reports  Review of Systems  Constitutional:  Negative for fatigue.  HENT:  Negative for mouth sores and mouth dryness.   Eyes:  Positive for dryness.  Respiratory:  Negative for shortness of breath.   Cardiovascular:  Negative for chest pain and palpitations.  Gastrointestinal:  Negative for blood in stool, constipation and diarrhea.  Endocrine: Negative for increased urination.  Genitourinary:  Positive for involuntary urination.  Musculoskeletal:  Positive for joint pain, joint pain, joint swelling, myalgias, morning stiffness, muscle tenderness and myalgias. Negative for gait problem and muscle weakness.  Skin:  Negative for color change, rash, hair loss and  sensitivity to sunlight.  Allergic/Immunologic: Negative for susceptible to infections.  Neurological:  Positive for headaches. Negative for dizziness.  Hematological:  Negative for swollen glands.  Psychiatric/Behavioral:  Negative for depressed mood and sleep disturbance. The patient is not nervous/anxious.     PMFS History:  Patient Active Problem List   Diagnosis Date Noted   Restless leg 09/11/2021   Paresthesia 03/01/2021   Primary osteoarthritis of both hands 11/10/2019   DDD (degenerative disc disease), lumbar 11/10/2019   Lumbar radiculopathy 04/30/2019   DDD (degenerative disc disease), cervical 08/07/2017   Coccygodynia 08/07/2017   Hx of migraines 07/18/2017   History of gastroesophageal reflux (GERD) 07/18/2017   History of depression 07/18/2017   Recurrent UTI 07/18/2017   Leukopenia 07/18/2017    Past Medical History:  Diagnosis Date   Arthritis    osteo   Chronic UTI    Depression    Frequent UTI    --sees Dr.McDiarmid   Headache    migraines went away with ment   Hypertrophy of uterus    Leukopenia 2016   WBC 3.8   Osteoporosis    Polyp of corpus uteri    Uterine prolapse     Family History  Problem Relation Age of Onset   Neuropathy Mother    Charcot-Marie-Tooth disease Mother    Hypertension Father    Osteoarthritis Father    Lupus Father    Thyroid disease Sister        removed    Healthy Son    Healthy  Son    Healthy Son    Past Surgical History:  Procedure Laterality Date   CHOLECYSTECTOMY  1998   COLONOSCOPY     CYSTOSCOPY     LUMBAR LAMINECTOMY/ DECOMPRESSION WITH MET-RX Left 05/01/2019   Procedure: LUMBAR FOUR-FIVE LAMINECTOMY/ DECOMPRESSION WITH MET-RX;  Surgeon: [name], MD;  Location: [facility];  Service: Neurosurgery;  Laterality: Left;   RETINAL TEAR REPAIR CRYOTHERAPY Right 2021   TUBAL LIGATION  1994   Social History   Social History Narrative   Not on file   Immunization History  Administered Date(s)  Administered   PFIZER(Purple Top)SARS-COV-2 Vaccination 06/07/2019, 06/28/2019     Objective: Vital Signs: BP 111/68 (BP Location: Left Arm, Patient Position: Sitting, Cuff Size: Normal)   Pulse 62   Resp 12   Ht 5\' 7"  (1.702 m)   Wt 130 lb (59 kg)   LMP 10/20/2014 (Exact Date)   BMI 20.36 kg/m    Physical Exam Vitals and nursing note reviewed.  Constitutional:      Appearance: She is well-developed.  HENT:     Head: Normocephalic and atraumatic.  Eyes:     Conjunctiva/sclera: Conjunctivae normal.  Cardiovascular:     Rate and Rhythm: Normal rate and regular rhythm.     Heart sounds: Normal heart sounds.  Pulmonary:     Effort: Pulmonary effort is normal.     Breath sounds: Normal breath sounds.  Abdominal:     General: Bowel sounds are normal.     Palpations: Abdomen is soft.  Musculoskeletal:     Cervical back: Normal range of motion.  Skin:    General: Skin is warm and dry.     Capillary Refill: Capillary refill takes less than 2 seconds.  Neurological:     Mental Status: She is alert and oriented to person, place, and time.  Psychiatric:        Behavior: Behavior normal.      Musculoskeletal Exam: C-spine, thoracic spine, lumbar spine have good range of motion.  Shoulder joints, elbow joints, wrist joints, MCPs, PIPs, DIPs have good range of motion with no synovitis.  Tenderness over the right [facility] joint with prominence.  Hip joints have good range of motion with no groin pain.  No tenderness over trochanteric bursa bilaterally.  Knee joints have good range of motion with no warmth or effusion.  Ankle joints have good range of motion with no tenderness or joint swelling.  CDAI Exam: CDAI Score: -- Patient Global: --; Provider Global: -- Swollen: --; Tender: -- Joint Exam 04/11/2022   No joint exam has been documented for this visit   There is currently no information documented on the homunculus. Go to the Rheumatology activity and complete the homunculus joint  exam.  Investigation: No additional findings.  Imaging: No results found.  Recent Labs: Lab Results  Component Value Date   WBC 4.6 12/30/2020   HGB 13.0 12/30/2020   PLT 186 12/30/2020   NA 141 12/30/2020   K 4.4 12/30/2020   CL 106 12/30/2020   CO2 27 12/30/2020   GLUCOSE 96 12/30/2020   BUN 14 12/30/2020   CREATININE 0.72 12/30/2020   BILITOT 0.6 12/30/2020   ALKPHOS 56 05/01/2019   AST 26 12/30/2020   ALT 21 12/30/2020   PROT 6.5 12/30/2020   ALBUMIN 4.0 05/01/2019   CALCIUM 9.0 12/30/2020   GFRAA 109 06/30/2020    Speciality Comments: No specialty comments available.  Procedures:  No procedures performed Allergies: Patient has no  known allergies.   Assessment / Plan:     Visit Diagnoses: Age-related osteoporosis without current pathological fracture -  November 27, 2021 DEXA scan T score -2.8, BMD 0.533 right femoral neck, no comparison.  Percentage change in AP spine 3%, percent change in left total femur 4%. Previous DEXA 11/25/2019 right femoral neck BMD 0.554 with T score -2.7. She has been taking  Fosamax 70 mg 1 tablet by mouth once weekly since October 2021.  She has been tolerating Fosamax without any side effects.  Her last dose of Fosamax was taken on 04/02/2022.  She has increased her dietary calcium intake and has been taking calcical 4 times daily and vitamin D 1000 units daily.  She has not had any recent falls or fractures. She was last in the office on 01/02/2022 at which time Dr. [name] recommended switching to IV Reclast.  The patient has been apprehensive of possible side effects of switching to IV Reclast.  She has done her own research online and would like to further review possible side effects.  Reviewed the indications, contraindications, and potential side effects of IV Reclast today in detail.  All questions were addressed.  Plan on updating CBC, CMP, and vitamin D prior to scheduling IV Reclast.  She will discontinue Fosamax-no refill will be  sent to the pharmacy today.  She will remain on calcium and vitamin D supplementation.  Next bone density will be due in October 2025. Plan: VITAMIN D 25 Hydroxy (Vit-D Deficiency, Fractures)  Vitamin D deficiency -She has been taking vitamin D 1000 units daily.  Vitamin D level rechecked today.  Plan: VITAMIN D 25 Hydroxy (Vit-D Deficiency, Fractures)  Medication management -CBC, CMP, vitamin D will be checked today.  Plan: COMPLETE METABOLIC PANEL WITH GFR, CBC with Differential/Platelet, VITAMIN D 25 Hydroxy (Vit-D Deficiency, Fractures)  Primary osteoarthritis of both hands: She has PIP and DIP thickening consistent with osteoarthritis of both hands.  CMC thickening and prominence noted bilaterally.  Tenderness over the right CMC joint.  She was given a prescription for a right CMC joint brace but has not yet purchased one.  Discussed the importance of joint protection and muscle strengthening.  It band syndrome, left: Improved with physical therapy previously.  DDD (degenerative disc disease), cervical: C-spine has slightly limited range of motion but no discomfort at this time.  DDD (degenerative disc disease), lumbar - Lumbar spine discectomy April 2021 by Dr. Venetia Constable.  Chronic SI joint pain: Improved.  Positive ANA (antinuclear antibody) - 10/12/19: AVISE index -2/4: 1:80H, anti-histone weak positive, ENA negative: No clinical features of systemic lupus.  Other medical conditions are listed as follows:   History of gastroesophageal reflux (GERD)  Other fatigue  History of depression  Orders: Orders Placed This Encounter  Procedures   COMPLETE METABOLIC PANEL WITH GFR   CBC with Differential/Platelet   VITAMIN D 25 Hydroxy (Vit-D Deficiency, Fractures)   No orders of the defined types were placed in this encounter.    Follow-Up Instructions: Return in about 6 months (around 10/12/2022) for Osteoporosis, Osteoarthritis.   [name], PA-C  Note - This record has  been created using Dragon software.  Chart creation errors have been sought, but may not always  have been located. Such creation errors do not reflect on  the standard of medical care.

## 2022-04-04 ENCOUNTER — Ambulatory Visit: Payer: 59 | Admitting: Physician Assistant

## 2022-04-11 ENCOUNTER — Encounter: Payer: Self-pay | Admitting: Physician Assistant

## 2022-04-11 ENCOUNTER — Ambulatory Visit: Payer: 59 | Attending: Physician Assistant | Admitting: Physician Assistant

## 2022-04-11 VITALS — BP 111/68 | HR 62 | Resp 12 | Ht 67.0 in | Wt 130.0 lb

## 2022-04-11 DIAGNOSIS — M19041 Primary osteoarthritis, right hand: Secondary | ICD-10-CM

## 2022-04-11 DIAGNOSIS — M533 Sacrococcygeal disorders, not elsewhere classified: Secondary | ICD-10-CM

## 2022-04-11 DIAGNOSIS — Z8719 Personal history of other diseases of the digestive system: Secondary | ICD-10-CM

## 2022-04-11 DIAGNOSIS — R768 Other specified abnormal immunological findings in serum: Secondary | ICD-10-CM

## 2022-04-11 DIAGNOSIS — E559 Vitamin D deficiency, unspecified: Secondary | ICD-10-CM

## 2022-04-11 DIAGNOSIS — Z8659 Personal history of other mental and behavioral disorders: Secondary | ICD-10-CM

## 2022-04-11 DIAGNOSIS — M19042 Primary osteoarthritis, left hand: Secondary | ICD-10-CM

## 2022-04-11 DIAGNOSIS — G8929 Other chronic pain: Secondary | ICD-10-CM

## 2022-04-11 DIAGNOSIS — Z79899 Other long term (current) drug therapy: Secondary | ICD-10-CM

## 2022-04-11 DIAGNOSIS — R5383 Other fatigue: Secondary | ICD-10-CM

## 2022-04-11 DIAGNOSIS — M810 Age-related osteoporosis without current pathological fracture: Secondary | ICD-10-CM | POA: Diagnosis not present

## 2022-04-11 DIAGNOSIS — M5136 Other intervertebral disc degeneration, lumbar region: Secondary | ICD-10-CM

## 2022-04-11 DIAGNOSIS — M5030 Other cervical disc degeneration, unspecified cervical region: Secondary | ICD-10-CM

## 2022-04-11 DIAGNOSIS — M7632 Iliotibial band syndrome, left leg: Secondary | ICD-10-CM

## 2022-04-11 NOTE — Progress Notes (Signed)
Pharmacy Note  Subjective: Patient presents today to the [facility] for follow up office visit.   Patient seen by pharmacist for counseling on Reclast therapy for osteoporosis. Prior osteoporosis treatment includes:Fosamax.  Objective: T-score 11/27/2021: -2.8 Lab Results  Component Value Date   VD25OH 69 12/30/2020   CMP     Component Value Date/Time   NA 141 12/30/2020 0936   K 4.4 12/30/2020 0936   CL 106 12/30/2020 0936   CO2 27 12/30/2020 0936   GLUCOSE 96 12/30/2020 0936   BUN 14 12/30/2020 0936   CREATININE 0.72 12/30/2020 0936   CALCIUM 9.0 12/30/2020 0936   PROT 6.5 12/30/2020 0936   ALBUMIN 4.0 05/01/2019 0631   AST 26 12/30/2020 0936   ALT 21 12/30/2020 0936   ALKPHOS 56 05/01/2019 0631   BILITOT 0.6 12/30/2020 0936   GFRNONAA 94 06/30/2020 1013   GFRAA 109 06/30/2020 1013   Assessment and Plan:  Counseled patient that Reclast is an IV bisphosphonate that reduces bone turnover by inhibiting osteoclasts that chew up bone.  Counseled patient on purpose, proper use, and adverse effects of Reclast.  Reviewed adverse events of Reclast including risk of nausea & diarrhea, headache, and muscle & bone pain.  Reviewed rare adverse effect of osteonecrosis of the jaw and advised patient to alert her dentist that she is on Reclast prior to any major dental work.  Patient confirms she does not have any major dental work scheduled at this time.    Reviewed importance of taking calcium and vitamin D with bisphosphonate therapy. Recommended daily amount of calcium is '1200mg'$  and vitamin D [phone] units.  Advised calcium is better obtained through diet vs supplement.  Counseled about risk of excess calcium supplementation such a kidney stones and increased risk of heart disease.    Provided patient with medication education material and answered all questions. Patient agrees to trial of Reclast IV infusion 5 mg yearly at this time. Unable to provide exact co-pay amount as  it is billed through medical not pharmacy benefit.  Patient verbalized understanding.  Maryan Puls, PharmD PGY-1 [facility] Pharmacy Resident

## 2022-04-12 ENCOUNTER — Encounter: Payer: Self-pay | Admitting: Physician Assistant

## 2022-04-12 ENCOUNTER — Telehealth: Payer: Self-pay | Admitting: Pharmacy Technician

## 2022-04-12 ENCOUNTER — Other Ambulatory Visit: Payer: Self-pay | Admitting: Pharmacist

## 2022-04-12 DIAGNOSIS — M810 Age-related osteoporosis without current pathological fracture: Secondary | ICD-10-CM

## 2022-04-12 LAB — COMPLETE METABOLIC PANEL WITH GFR
AG Ratio: 2.2 (calc) (ref 1.0–2.5)
ALT: 21 U/L (ref 6–29)
AST: 28 U/L (ref 10–35)
Albumin: 4.8 g/dL (ref 3.6–5.1)
Alkaline phosphatase (APISO): 47 U/L (ref 37–153)
BUN: 16 mg/dL (ref 7–25)
CO2: 30 mmol/L (ref 20–32)
Calcium: 9.4 mg/dL (ref 8.6–10.4)
Chloride: 105 mmol/L (ref 98–110)
Creat: 0.66 mg/dL (ref 0.50–1.05)
Globulin: 2.2 g/dL (calc) (ref 1.9–3.7)
Glucose, Bld: 94 mg/dL (ref 65–99)
Potassium: 4.6 mmol/L (ref 3.5–5.3)
Sodium: 142 mmol/L (ref 135–146)
Total Bilirubin: 0.6 mg/dL (ref 0.2–1.2)
Total Protein: 7 g/dL (ref 6.1–8.1)
eGFR: 100 mL/min/{1.73_m2} (ref >=60)

## 2022-04-12 LAB — CBC WITH DIFFERENTIAL/PLATELET
Absolute Monocytes: 464 cells/uL (ref 200–950)
Basophils Absolute: 30 cells/uL (ref 0–200)
Basophils Relative: 0.8 %
Eosinophils Absolute: 167 cells/uL (ref 15–500)
Eosinophils Relative: 4.4 %
HCT: 39.4 % (ref 35.0–45.0)
Hemoglobin: 12.8 g/dL (ref 11.7–15.5)
Lymphs Abs: 1315 cells/uL (ref 850–3900)
MCH: 29.4 pg (ref 27.0–33.0)
MCHC: 32.5 g/dL (ref 32.0–36.0)
MCV: 90.4 fL (ref 80.0–100.0)
MPV: 11 fL (ref 7.5–12.5)
Monocytes Relative: 12.2 %
Neutro Abs: 1824 cells/uL (ref 1500–7800)
Neutrophils Relative %: 48 %
Platelets: 195 10*3/uL (ref 140–400)
RBC: 4.36 10*6/uL (ref 3.80–5.10)
RDW: 13.1 % (ref 11.0–15.0)
Total Lymphocyte: 34.6 %
WBC: 3.8 10*3/uL (ref 3.8–10.8)

## 2022-04-12 LAB — VITAMIN D 25 HYDROXY (VIT D DEFICIENCY, FRACTURES): Vit D, 25-Hydroxy: 48 ng/mL (ref 30–100)

## 2022-04-12 NOTE — Progress Notes (Signed)
Patient is newly starting Reclast for age-related osteoporosis. Has received three years of Fosamax so will only be able to receive two consecutive Reclast infusions before drug holiday.  Dose: 5 mg IV every 12 months  Last Clinic Visit: 04/12/22 Next Clinic Visit: 10/17/22  Labs: CBC, CMP, Vitamin D 04/11/22 wnl  Orders placed for Reclast IV x 1 dose along with premedication of acetaminophen and diphenhydramine to be administered 30 minutes before medication infusion.  Knox Saliva, PharmD, MPH, BCPS, CPP Clinical Pharmacist (Rheumatology and Pulmonology)

## 2022-04-12 NOTE — Telephone Encounter (Signed)
Colette Ribas note:  Auth Submission: NO AUTH NEEDED Payer: [employer] Medication & CPT/J Code(s) submitted: Reclast (Zolendronic acid) [id number] Route of submission (phone, fax, portal):  Phone # Fax # Auth type: Buy/Bill Units/visits requested: 1 Reference number:  Approval from: 04/12/22 to 01/29/23  Patient will be scheduled as soon as possible

## 2022-04-12 NOTE — Progress Notes (Signed)
CBC and CMP WNL. Vitamin D WNL

## 2022-04-12 NOTE — Telephone Encounter (Signed)
Reclast order placed for [place]. Labs from 04/11/22 wnl  Knox Saliva, PharmD, MPH, BCPS, CPP Clinical Pharmacist (Rheumatology and Pulmonology)

## 2022-05-01 NOTE — Progress Notes (Signed)
Reclast scheduled for 05/18/22  Knox Saliva, PharmD, MPH, BCPS, CPP Clinical Pharmacist (Rheumatology and Pulmonology)

## 2022-05-18 ENCOUNTER — Ambulatory Visit (INDEPENDENT_AMBULATORY_CARE_PROVIDER_SITE_OTHER): Payer: 59

## 2022-05-18 VITALS — BP 134/73 | HR 53 | Temp 98.0°F | Resp 18 | Ht 67.0 in | Wt 129.6 lb

## 2022-05-18 DIAGNOSIS — M810 Age-related osteoporosis without current pathological fracture: Secondary | ICD-10-CM

## 2022-05-18 MED ORDER — ZOLEDRONIC ACID 5 MG/100ML IV SOLN
5.0000 mg | Freq: Once | INTRAVENOUS | Status: AC
  Administered 2022-05-18: 5 mg via INTRAVENOUS
  Filled 2022-05-18: qty 100

## 2022-05-18 MED ORDER — ACETAMINOPHEN 325 MG PO TABS: 650.0000 mg | ORAL_TABLET | Freq: Once | ORAL | Status: DC

## 2022-05-18 MED ORDER — DIPHENHYDRAMINE HCL 25 MG PO CAPS
25.0000 mg | ORAL_CAPSULE | Freq: Once | ORAL | Status: AC
  Administered 2022-05-18: 25 mg via ORAL
  Filled 2022-05-18: qty 1

## 2022-05-18 NOTE — Progress Notes (Signed)
Diagnosis: Osteoporosis  Provider:  Chilton Greathouse MD  Procedure: Infusion  IV Type: Peripheral, IV Location: L Antecubital  Reclast (Zolendronic Acid), Dose: 5 mg  Infusion Start Time: 1044  Infusion Stop Time: 1113  Post Infusion IV Care: Observation period completed and Peripheral IV Discontinued  Discharge: Condition: Good, Destination: Home . AVS Provided and AVS Declined  Performed by:  Adriana Mccallum, RN

## 2022-05-21 ENCOUNTER — Encounter: Payer: Self-pay | Admitting: Neurology

## 2022-05-21 ENCOUNTER — Ambulatory Visit (INDEPENDENT_AMBULATORY_CARE_PROVIDER_SITE_OTHER): Payer: 59 | Admitting: Neurology

## 2022-05-21 VITALS — BP 110/73 | HR 76 | Ht 67.0 in | Wt 128.5 lb

## 2022-05-21 DIAGNOSIS — G2581 Restless legs syndrome: Secondary | ICD-10-CM

## 2022-05-21 DIAGNOSIS — R202 Paresthesia of skin: Secondary | ICD-10-CM

## 2022-05-21 DIAGNOSIS — R292 Abnormal reflex: Secondary | ICD-10-CM

## 2022-05-21 DIAGNOSIS — M542 Cervicalgia: Secondary | ICD-10-CM | POA: Diagnosis not present

## 2022-05-21 MED ORDER — GABAPENTIN 100 MG PO CAPS: 200.0000 mg | ORAL_CAPSULE | Freq: Every day | ORAL | 3 refills | Status: DC

## 2022-05-21 NOTE — Progress Notes (Unsigned)
Chief Complaint  Patient presents with   Follow-up    Rm 15. C/o weakness. She has difficulty with strength and stamina. C/o leg pain at night, pain in the bottom of feet.      ASSESSMENT AND PLAN  Maureen Davis is a 62 y.o. female   Bilateral lower extremity paresthesia,  History suggestive of restless leg syndrome,  EMG nerve conduction study April 2022 showed no significant abnormality  MRI of lumbar spine with and without contrast July 2023 showed mild degenerative changes, no significant canal or foraminal narrowing,  Gabapentin 200 mg every night was helpful,  Brisk reflex on examinations, does complains of chronic low back pain, neck pain, lack of stamina, MRI of cervical spine to rule out cervical spondylitic myelopathy     DIAGNOSTIC DATA (LABS, IMAGING, TESTING) - I reviewed patient records, labs, notes, testing and imaging myself where available. 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  Reported maternal grandfather also suffered disease,   MEDICAL HISTORY:  LAVERDA STRIBLING is a patient of Dr. Anne Hahn, followed up by our clinic for intermittent lower extremity paresthesia, history of lumbar decompression surgery   I reviewed and summarized the referring note. PMHX Osteopenia Depression, anxiety Lumbar decompression surgery in April 2021.  She suffered back injury waterskiing in 2020, April 2021, she was seen at emergency room for severe low back pain, radiating pain to left lower extremity, also complains of left weakness,  MRI of lumbar spine showed correlating large L4-5 disc herniation, she underwent left L4-5 micro discectomy on May 01, 2019,  Post operative MRI lumbar on Jun 08, 2020 was unremarkable, there was no significant canal or foraminal narrowing  Surgery did help her pain, she still required prolonged rehabilitation, complains of mild residual left lower extremity weakness  Today she also concerned about right foot numbness started around 2021, stayed the same, over the past few months, she noticed intermittent left lower extremity numbness, left ankle constant deep achy pain, getting worse in cold weather, prolonged standing,  The most bothersome symptoms is at nighttime, when she is trying to rest," rating comes alive", she complains left hip discomfort, hard to find a comfortable position, difficult to sit for extended period of time, worsening on the left side, urged to move her leg, occasionally has to pace around  She denied persistent gait abnormality no bowel or bladder incontinence, she does suffer long history of depression anxiety  Repeat MRI of lumbar spine August 18, 2021, L4-5 postsurgical changes from left laminectomy, facetectomy, but no significant canal or foraminal narrowing  UPDATE May 21 2022: She still feel the same way, more pronounced on left, wear shoes all the times, otherwise, it is painful, as if she is walking on  peebles, she went to beach, she could not walk on concrete. She still has night,   She could nto sleep with out gabapentin  2 tabs at night,   EMG/NCS in Apirl 2022. Nerve conduction studies done on both lower extremities were relatively unremarkable, without evidence of a peripheral neuropathy. EMG evaluation of the left lower extremity showed mild chronic stable signs of an L5 radiculopathy with evidence of mild acute denervation in the S1 nerve root distribution. EMG evaluation of the right lower extremity showed mild acute  and chronic changes in the S1 nerve root distribution.   PHYSICAL EXAM:   Vitals:   05/21/22 0900  BP: 110/73  Pulse: 76  Weight: 128 lb 8 oz (58.3 kg)  Height: 5\' 7"  (1.702 m)   Not  recorded     Body mass index is 20.13 kg/m.  PHYSICAL EXAMNIATION:  Gen: NAD, conversant, well nourised, well groomed                     Cardiovascular: Regular rate rhythm, no peripheral edema, warm, nontender. Eyes: Conjunctivae clear without exudates or hemorrhage Neck: Supple, no carotid bruits. Pulmonary: Clear to auscultation bilaterally   NEUROLOGICAL EXAM:  MENTAL STATUS: Speech/cognition: Awake, alert, oriented to history taking and casual conversation CRANIAL NERVES: CN II: Visual fields are full to confrontation. Pupils are round equal and briskly reactive to light. CN III, IV, VI: extraocular movement are normal. No ptosis. CN V: Facial sensation is intact to light touch CN VII: Face is symmetric with normal eye closure  CN VIII: Hearing is normal to causal conversation. CN IX, X: Phonation is normal. CN XI: Head turning and shoulder shrug are intact  MOTOR: There is no pronator drift of out-stretched arms. Muscle bulk and tone are normal. Muscle strength is normal.  REFLEXES: Reflexes are 2+ and symmetric at the biceps, triceps, knees, and ankles. Plantar responses are flexor.  SENSORY: Intact to light touch, pinprick and vibratory sensation are intact in fingers and toes.  COORDINATION: There is no trunk or limb dysmetria noted.  GAIT/STANCE: Posture is normal. Gait is steady with normal steps, base, arm swing, and turning. Heel and toe walking are normal. Tandem gait is normal.  Romberg is absent.  REVIEW OF SYSTEMS:  Full 14 system review of systems performed and notable only for as above All other review of systems were negative.   ALLERGIES: No Known Allergies  HOME MEDICATIONS: Current Outpatient Medications  Medication Sig Dispense Refill   carisoprodol (SOMA) 350 MG tablet Take 350 mg by mouth as needed for muscle spasms.     citalopram (CELEXA) 10 MG tablet TAKE 2 TABLETS BY MOUTH EVERY DAY (Patient taking differently: Take 10 mg by  mouth daily.) 180 tablet 4   gabapentin (NEURONTIN) 100 MG capsule Take 2 capsules (200 mg total) by mouth 3 (three) times daily. (Patient taking differently: Take 200 mg by mouth at bedtime.) 180 capsule 11   zoledronic acid (RECLAST) 5 MG/100ML SOLN injection Inject 5 mg into the vein once.     No current facility-administered medications for this visit.    PAST MEDICAL HISTORY: Past Medical History:  Diagnosis Date   Arthritis    osteo   Chronic UTI    Depression    Frequent UTI    --sees Dr.McDiarmid   Headache    migraines went away with ment   Hypertrophy of uterus    Leukopenia 2016   WBC 3.8   Osteoporosis    Polyp of corpus uteri    Uterine prolapse     PAST SURGICAL HISTORY: Past Surgical History:  Procedure Laterality Date   CHOLECYSTECTOMY  1998   COLONOSCOPY     CYSTOSCOPY     LUMBAR LAMINECTOMY/ DECOMPRESSION WITH MET-RX Left 05/01/2019   Procedure: LUMBAR FOUR-FIVE LAMINECTOMY/ DECOMPRESSION WITH MET-RX;  Surgeon: Jadene Pierini, MD;  Location: [facility];  Service: Neurosurgery;  Laterality: Left;   RETINAL TEAR REPAIR CRYOTHERAPY Right 2021   TUBAL LIGATION  1994    FAMILY HISTORY:  Family History  Problem Relation Age of Onset   Neuropathy Mother    Charcot-Marie-Tooth disease Mother    Hypertension Father    Osteoarthritis Father    Lupus Father    Thyroid disease Sister        removed    Healthy Son    Healthy Son    Healthy Son     SOCIAL HISTORY: Social History   Socioeconomic History   Marital status: Married    Spouse name: Not on file   Number of children: Not on file   Years of education: Not on file   Highest education level: Not on file  Occupational History   Not on file  Tobacco Use   Smoking status: Never    Passive exposure: Never   Smokeless tobacco: Never  Vaping Use   Vaping Use: Never used  Substance and Sexual Activity   Alcohol use: Yes    Comment: occ   Drug use: Never   Sexual activity: Yes    Partners:  Male    Birth control/protection: Surgical    Comment: Tubal Ligation  Other Topics Concern   Not on file  Social History Narrative   Not on file   Social Determinants of Health   Financial Resource Strain: Not on file  Food Insecurity: Not on file  Transportation Needs: Not on file  Physical Activity: Not on file  Stress: Not on file  Social Connections: Not on file  Intimate Partner Violence: Not on file      Levert Feinstein, M.D. Ph.D.  [facility] [address] [place], [place] [address] Ph: [phone] Fax: [phone]  CC:  Mila Palmer, MD [address] [place],  [place] [address]  Mila Palmer, MD

## 2022-06-06 NOTE — Progress Notes (Unsigned)
62 y.o. [id number] Married Caucasian female here for annual exam.   Declines vaginal estrogen.   Will have retinal surgery.  Taking care of her grand daughter.  PCP:  Dr. Paulino Rily   Patient's last menstrual period was 10/20/2014 (exact date).           Sexually active: Yes.    The current method of family planning is post menopausal status/BTL.    Exercising: Yes.     Daily activities with granddaughter Smoker:  no  Health Maintenance: Pap:  01/10/18 neg: HR HPV neg, 12/21/14 neg History of abnormal Pap:  yes, 1994 cryo MMG:  11/27/21 Breast Density Cat C, BI-RADS CAT 2 benign Colonoscopy:  2016 normal, 10 yr f/u BMD:   11/29/21  Result  osteoporosis - Dr. Corliss Skains TDaP:  PCP Gardasil:   no HIV: n/a Hep C: 08/29/20 neg Screening Labs:  PCP   reports that she has never smoked. She has never been exposed to tobacco smoke. She has never used smokeless tobacco. She reports current alcohol use. She reports that she does not use drugs.  Past Medical History:  Diagnosis Date   Arthritis    osteo   Chronic UTI    Depression    Frequent UTI    --sees Dr.McDiarmid   Headache    migraines went away with ment   Hypertrophy of uterus    Leukopenia 2016   WBC 3.8   Osteoporosis    Polyp of corpus uteri    Uterine prolapse     Past Surgical History:  Procedure Laterality Date   CHOLECYSTECTOMY  1998   COLONOSCOPY     CYSTOSCOPY     LUMBAR LAMINECTOMY/ DECOMPRESSION WITH MET-RX Left 05/01/2019   Procedure: LUMBAR FOUR-FIVE LAMINECTOMY/ DECOMPRESSION WITH MET-RX;  Surgeon: Jadene Pierini, MD;  Location: [facility] OR;  Service: Neurosurgery;  Laterality: Left;   RETINAL TEAR REPAIR CRYOTHERAPY Right 2021   TUBAL LIGATION  1994    Current Outpatient Medications  Medication Sig Dispense Refill   carisoprodol (SOMA) 350 MG tablet Take 350 mg by mouth as needed for muscle spasms.     citalopram (CELEXA) 10 MG tablet TAKE 2 TABLETS BY MOUTH EVERY DAY (Patient taking differently: Take  10 mg by mouth daily.) 180 tablet 4   gabapentin (NEURONTIN) 100 MG capsule Take 2 capsules (200 mg total) by mouth at bedtime. 180 capsule 3   zoledronic acid (RECLAST) 5 MG/100ML SOLN injection Inject 5 mg into the vein once.     No current facility-administered medications for this visit.    Family History  Problem Relation Age of Onset   Neuropathy Mother    Charcot-Marie-Tooth disease Mother    Hypertension Father    Osteoarthritis Father    Lupus Father    Thyroid disease Sister        removed    Healthy Son    Healthy Son    Healthy Son     Review of Systems  All other systems reviewed and are negative.   Exam:   BP 118/66 (BP Location: Left Arm, Patient Position: Sitting, Cuff Size: Normal)   Pulse 65   Ht 5' 7.5" (1.715 m)   Wt 126 lb (57.2 kg)   LMP 10/20/2014 (Exact Date)   SpO2 99%   BMI 19.44 kg/m     General appearance: alert, cooperative and appears stated age Head: normocephalic, without obvious abnormality, atraumatic Neck: no adenopathy, supple, symmetrical, trachea midline and thyroid normal to inspection and palpation Lungs: clear  to auscultation bilaterally Breasts: normal appearance, no masses or tenderness, No nipple retraction or dimpling, No nipple discharge or bleeding, No axillary adenopathy Heart: regular rate and rhythm Abdomen: soft, non-tender; no masses, no organomegaly Extremities: extremities normal, atraumatic, no cyanosis or edema Skin: skin color, texture, turgor normal. No rashes or lesions Lymph nodes: cervical, supraclavicular, and axillary nodes normal. Neurologic: grossly normal  Pelvic: External genitalia:  no lesions              No abnormal inguinal nodes palpated.              Urethra:  normal appearing urethra with no masses, tenderness or lesions              Bartholins and Skenes: normal                 Vagina: normal appearing vagina with normal color and discharge, no lesions              Cervix: no lesions               Pap taken: yes Bimanual Exam:  Uterus:  normal size, contour, position, consistency, mobility, non-tender              Adnexa: no mass, fullness, tenderness              Rectal exam: yes.  Confirms.              Anus:  normal sphincter tone, no lesions  Chaperone was present for exam:  Warren Lacy, CMA  Assessment:   Well woman visit with gynecologic exam. Hx UTI.  Osteoarthritis.  Osteoporosis.  On Reclast.   Plan: Mammogram screening discussed. Self breast awareness reviewed. Pap and HR HPV collected. Guidelines for Calcium, Vitamin D, regular exercise program including cardiovascular and weight bearing exercise.   Follow up annually and prn.

## 2022-06-12 ENCOUNTER — Ambulatory Visit: Payer: 59 | Admitting: Obstetrics and Gynecology

## 2022-06-13 ENCOUNTER — Ambulatory Visit: Payer: 59 | Admitting: Obstetrics and Gynecology

## 2022-06-20 ENCOUNTER — Encounter: Payer: Self-pay | Admitting: Obstetrics and Gynecology

## 2022-06-20 ENCOUNTER — Ambulatory Visit (INDEPENDENT_AMBULATORY_CARE_PROVIDER_SITE_OTHER): Payer: 59 | Admitting: Obstetrics and Gynecology

## 2022-06-20 ENCOUNTER — Other Ambulatory Visit (HOSPITAL_COMMUNITY)
Admission: RE | Admit: 2022-06-20 | Discharge: 2022-06-20 | Disposition: A | Discharge status: Home / Self Care | Payer: 59 | Source: Ambulatory Visit | Attending: Obstetrics and Gynecology | Admitting: Obstetrics and Gynecology

## 2022-06-20 VITALS — BP 118/66 | HR 65 | Ht 67.5 in | Wt 126.0 lb

## 2022-06-20 DIAGNOSIS — Z124 Encounter for screening for malignant neoplasm of cervix: Secondary | ICD-10-CM

## 2022-06-20 DIAGNOSIS — Z01419 Encounter for gynecological examination (general) (routine) without abnormal findings: Secondary | ICD-10-CM

## 2022-06-21 NOTE — Patient Instructions (Signed)

## 2022-06-22 LAB — CYTOLOGY - PAP
Comment: NEGATIVE
Diagnosis: NEGATIVE
High risk HPV: NEGATIVE

## 2022-07-09 IMAGING — CR DG CHEST 2V
2 series · 2 of 2 positions shown · non-contrast
Comparison: None.

CLINICAL DATA: Cough with chest tightness for 1 month.

EXAM:
CHEST - 2 VIEW

[w chest pa]
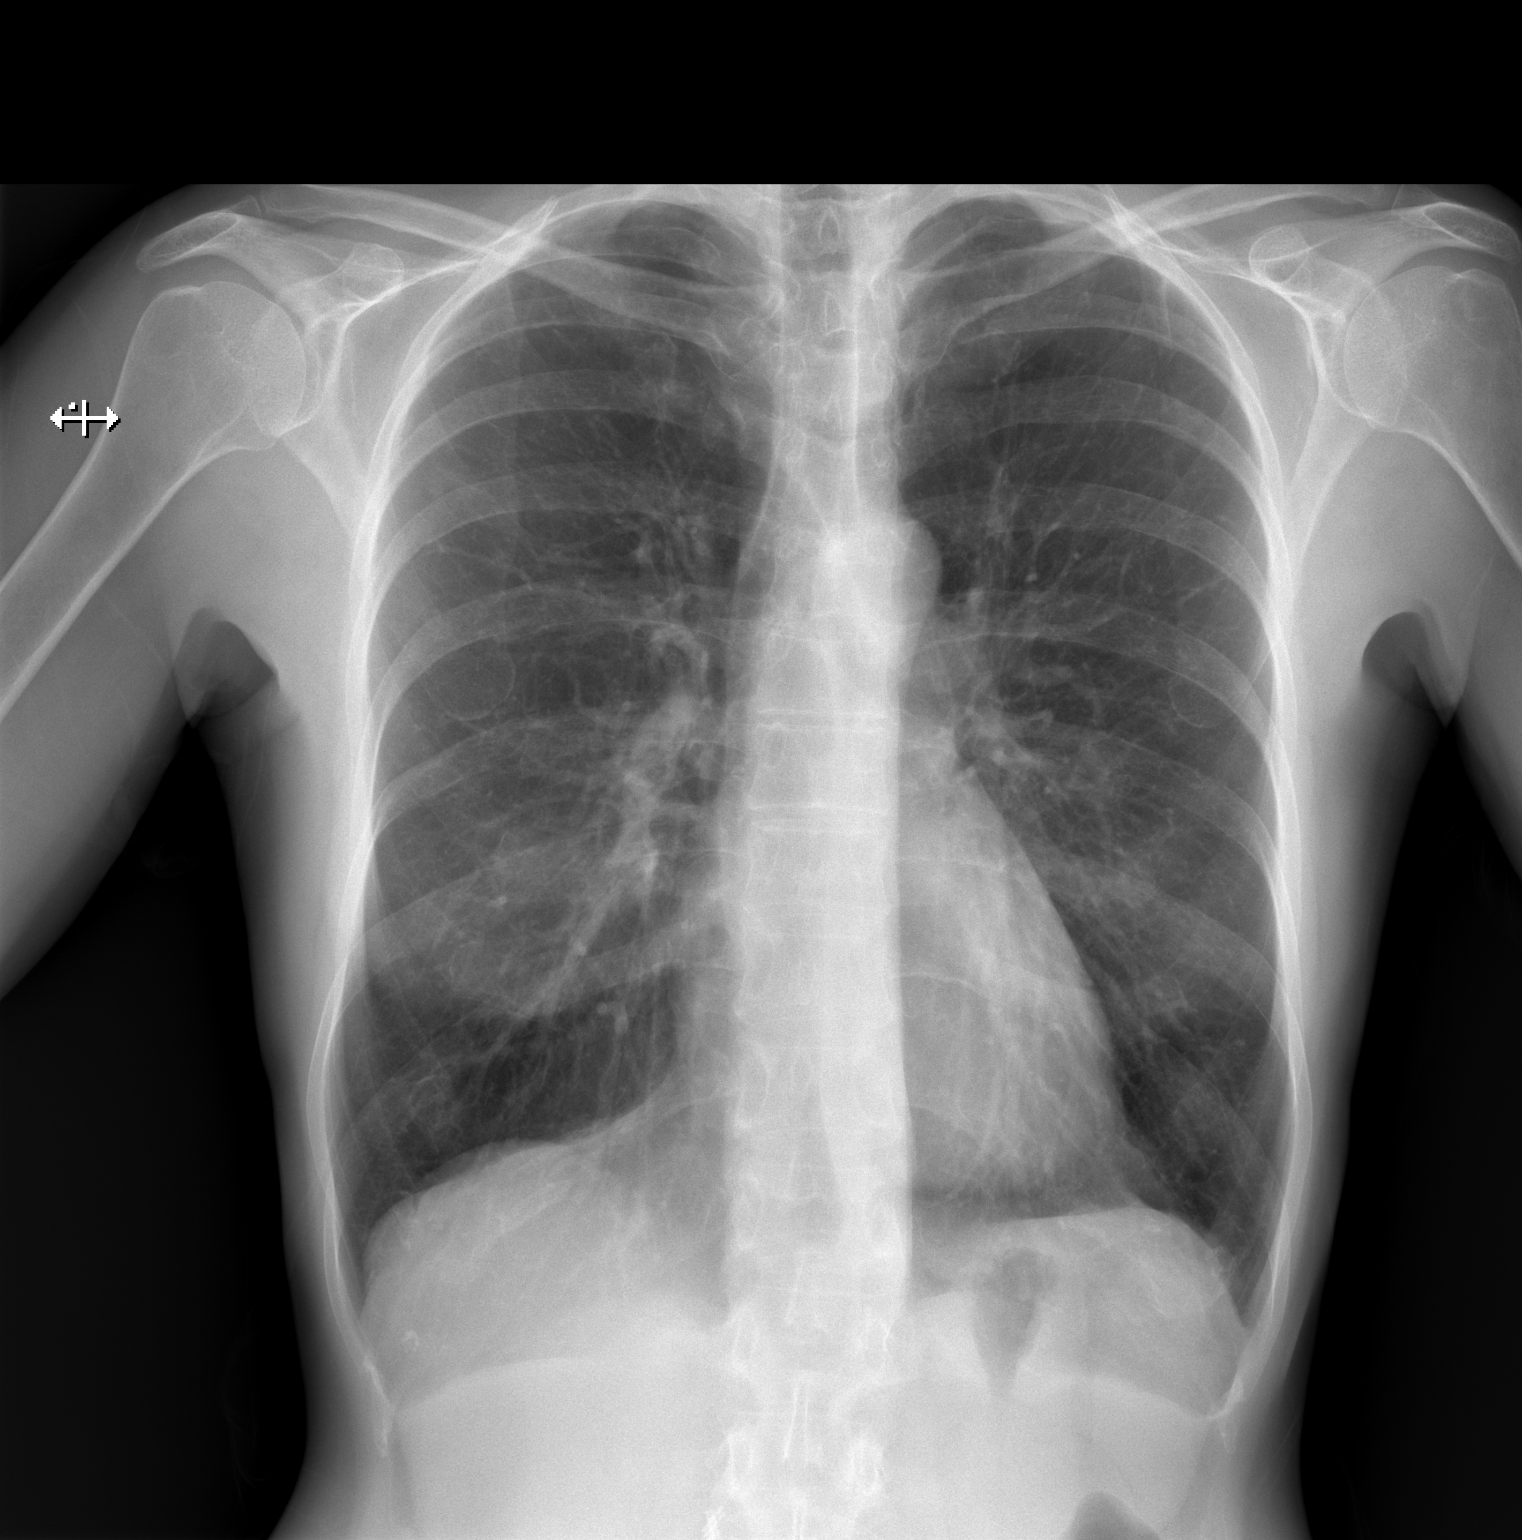

[w chest lat]
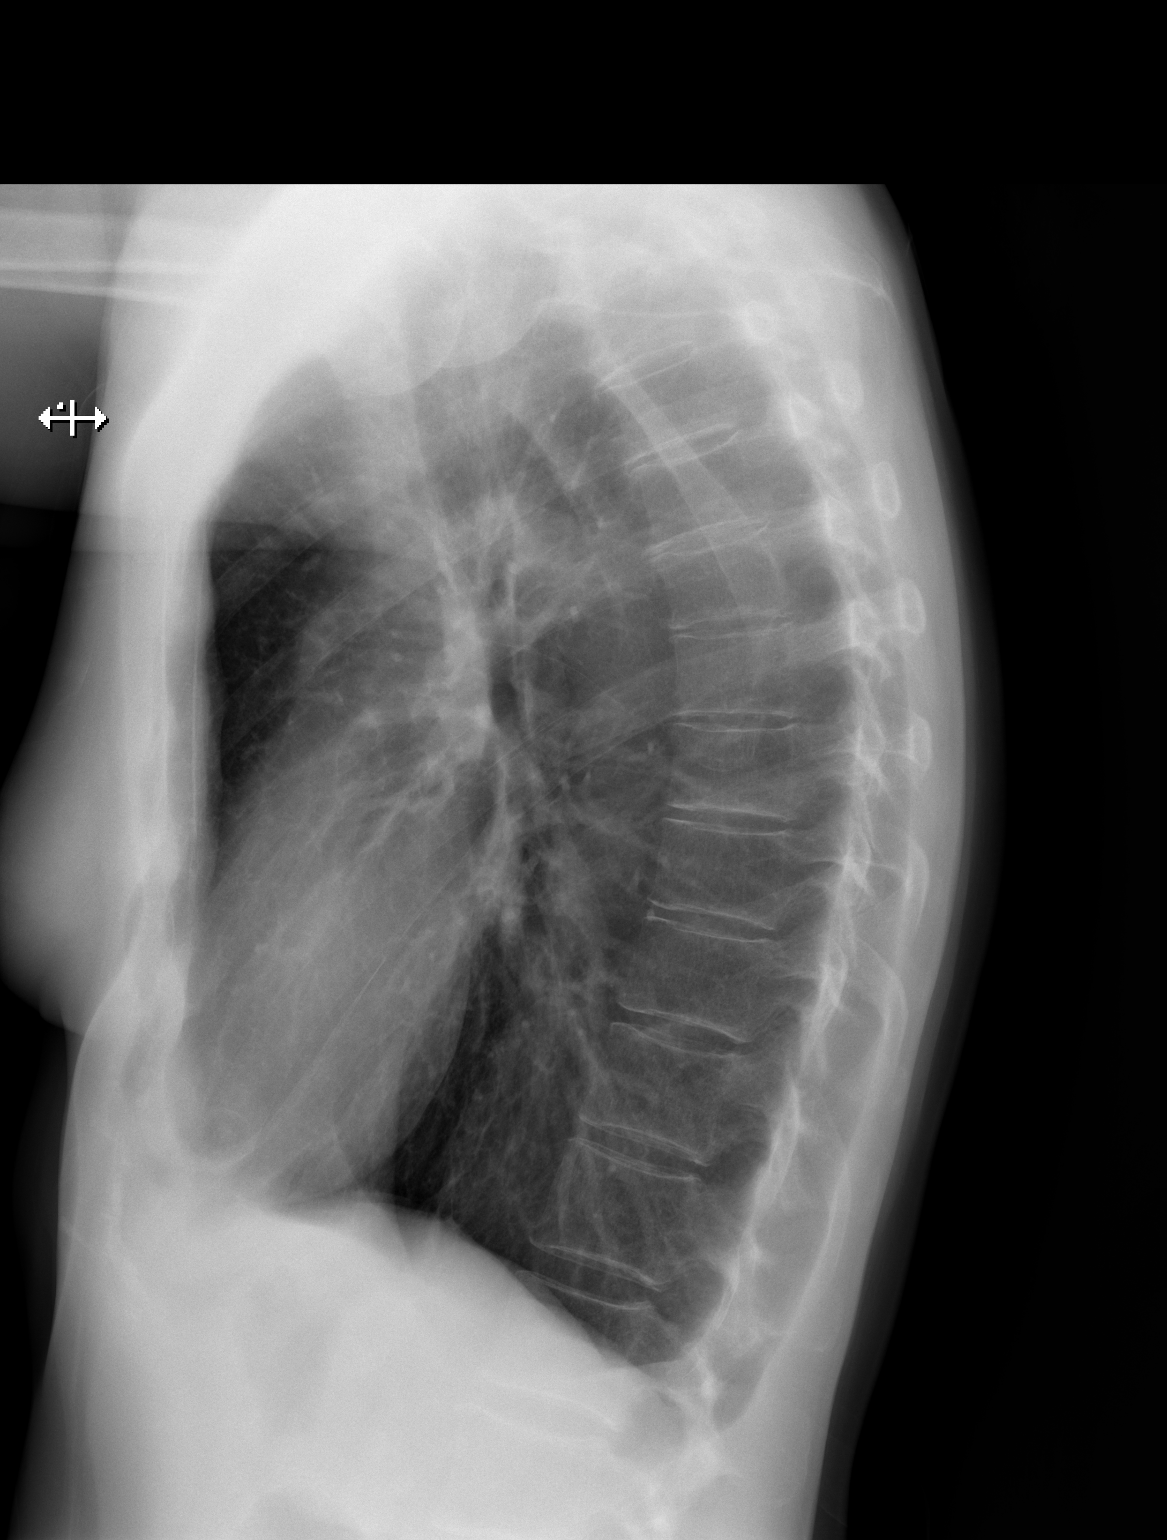

[2 of 2 positions shown; findings below may reference images not displayed]

FINDINGS: The heart size and mediastinal contours are within normal limits.
Both lungs are clear. The visualized skeletal structures are
unremarkable.
IMPRESSION: No active cardiopulmonary disease.

## 2022-07-19 ENCOUNTER — Telehealth: Payer: Self-pay | Admitting: Neurology

## 2022-07-19 NOTE — Telephone Encounter (Signed)
I called her back and got her scheduled at [facility] on 7/10 at 1pm 30 mins MR cervical wo Dr. Terrace [facility] NPR case #[id number]

## 2022-07-19 NOTE — Telephone Encounter (Signed)
Pt is asking for a call to schedule her MRI 

## 2022-08-08 ENCOUNTER — Ambulatory Visit (INDEPENDENT_AMBULATORY_CARE_PROVIDER_SITE_OTHER): Payer: 59

## 2022-08-08 DIAGNOSIS — R292 Abnormal reflex: Secondary | ICD-10-CM

## 2022-08-08 DIAGNOSIS — R202 Paresthesia of skin: Secondary | ICD-10-CM | POA: Diagnosis not present

## 2022-08-08 DIAGNOSIS — G2581 Restless legs syndrome: Secondary | ICD-10-CM | POA: Diagnosis not present

## 2022-08-08 DIAGNOSIS — M542 Cervicalgia: Secondary | ICD-10-CM

## 2022-10-05 NOTE — Progress Notes (Signed)
Office Visit Note  Patient: Maureen Davis             Date of Birth: January 28, 1961           MRN: [id number]             PCP: Mila Palmer, MD Referring: Mila Palmer, MD Visit Date: 10/17/2022 Occupation: @GUAROCC @  Subjective:  Lower back pain  History of Present Illness: Maureen Davis is a 62 y.o. female osteoporosis and osteoarthritis.  She had a Reclast infusion on May 18, 2022.  Patient had no side effects from Reclast.  She has been taking calcium and vitamin D.  Patient states she had recent labs with her PCP which showed normal vitamin D and CMP.  She will forward results to [place].  She states she was recently taking care of her granddaughter and lifting her.  She started experiencing neck and lower back pain.  She states she was seen for the neck pain by Dr. Debarah Crape and had MRI of her cervical spine which showed some degenerative changes.  Dr. Debarah Crape also noticed hyperreflexia per patient.  She has been experiencing some increased lower back pain.  She had physical therapy and dry needling in the past which was useful.  Patient states she has some of the exercises sitting at home which she will start doing.  She denies any radiculopathy.  She continues to have some stiffness in her hands and SI joints.  She has not noticed any joint swelling.  There is no history of oral ulcers, nasal ulcers, malar rash, photosensitivity, Raynaud's or lymphadenopathy.  Patient had surgery on her left eye recently for retinal puckering.  She had redness in her left eye.    Activities of Daily Living:  Patient reports morning stiffness for 0 minutes.   Patient Reports nocturnal pain.  Difficulty dressing/grooming: Denies Difficulty climbing stairs: Denies Difficulty getting out of chair: Denies Difficulty using hands for taps, buttons, cutlery, and/or writing: Reports  Review of Systems  Constitutional:  Negative for fatigue.  HENT:  Negative for mouth sores and mouth dryness.   Eyes:  Positive  for itching, visual disturbance and dryness.  Respiratory:  Negative for shortness of breath.   Cardiovascular:  Negative for chest pain and palpitations.  Gastrointestinal:  Negative for blood in stool, constipation and diarrhea.  Endocrine: Negative for increased urination.  Genitourinary:  Negative for involuntary urination.  Musculoskeletal:  Positive for joint pain, joint pain, joint swelling, myalgias and myalgias. Negative for gait problem, muscle weakness, morning stiffness and muscle tenderness.  Skin:  Negative for color change, rash, hair loss and sensitivity to sunlight.  Allergic/Immunologic: Negative for susceptible to infections.  Neurological:  Negative for dizziness and headaches.  Hematological:  Negative for swollen glands.  Psychiatric/Behavioral:  Negative for depressed mood and sleep disturbance. The patient is not nervous/anxious.     PMFS History:  Patient Active Problem List   Diagnosis Date Noted   Hyperreflexia 05/21/2022   Neck pain 05/21/2022   Age related osteoporosis 04/12/2022   Restless leg 09/11/2021   Paresthesia 03/01/2021   Primary osteoarthritis of both hands 11/10/2019   DDD (degenerative disc disease), lumbar 11/10/2019   Lumbar radiculopathy 04/30/2019   DDD (degenerative disc disease), cervical 08/07/2017   Coccygodynia 08/07/2017   Hx of migraines 07/18/2017   History of gastroesophageal reflux (GERD) 07/18/2017   History of depression 07/18/2017   Recurrent UTI 07/18/2017   Leukopenia 07/18/2017    Past Medical History:  Diagnosis Date  Arthritis    osteo   Chronic UTI    Depression    Frequent UTI    --sees Dr.McDiarmid   Headache    migraines went away with ment   Hypertrophy of uterus    Leukopenia 2016   WBC 3.8   Osteoporosis    Polyp of corpus uteri    Uterine prolapse     Family History  Problem Relation Age of Onset   Neuropathy Mother    Charcot-Marie-Tooth disease Mother    Hypertension Father     Osteoarthritis Father    Lupus Father    Thyroid disease Sister        removed    Healthy Son    Healthy Son    Healthy Son    Past Surgical History:  Procedure Laterality Date   CHOLECYSTECTOMY  1998   COLONOSCOPY     CYSTOSCOPY     LUMBAR LAMINECTOMY/ DECOMPRESSION WITH MET-RX Left 05/01/2019   Procedure: LUMBAR FOUR-FIVE LAMINECTOMY/ DECOMPRESSION WITH MET-RX;  Surgeon: Jadene Pierini, MD;  Location: [facility] OR;  Service: Neurosurgery;  Laterality: Left;   RETINAL TEAR REPAIR CRYOTHERAPY Right 2021   TUBAL LIGATION  1994   VITRECTOMY Left 10/11/2022   Social History   Social History Narrative   Not on file   Immunization History  Administered Date(s) Administered   PFIZER(Purple Top)SARS-COV-2 Vaccination 06/07/2019, 06/28/2019     Objective: Vital Signs: BP 123/77 (BP Location: Left Arm, Patient Position: Sitting, Cuff Size: Normal)   Pulse 62   Resp 14   Ht 5\' 7"  (1.702 m)   Wt 127 lb 6.4 oz (57.8 kg)   LMP 10/20/2014 (Exact Date)   BMI 19.95 kg/m    Physical Exam Vitals and nursing note reviewed.  Constitutional:      Appearance: She is well-developed.  HENT:     Head: Normocephalic and atraumatic.  Eyes:     Conjunctiva/sclera: Conjunctivae normal.     Comments: Left conjunctival injection was noted.  Cardiovascular:     Rate and Rhythm: Normal rate and regular rhythm.     Heart sounds: Normal heart sounds.  Pulmonary:     Effort: Pulmonary effort is normal.     Breath sounds: Normal breath sounds.  Abdominal:     General: Bowel sounds are normal.     Palpations: Abdomen is soft.  Musculoskeletal:     Cervical back: Normal range of motion.  Lymphadenopathy:     Cervical: No cervical adenopathy.  Skin:    General: Skin is warm and dry.     Capillary Refill: Capillary refill takes less than 2 seconds.  Neurological:     Mental Status: She is alert and oriented to person, place, and time.  Psychiatric:        Behavior: Behavior normal.       Musculoskeletal Exam: Cervical spine was in good range of motion.  She has some tenderness over lower lumbar region.  Shoulder joints, elbow joints, wrist joints, MCPs PIPs and DIPs with good range of motion.  She had bilateral CMC PIP and DIP thickening with no synovitis.  Hip joints and knee joints with good range of motion without any warmth swelling or effusion.  There was no tenderness over ankles or MTPs.  CDAI Exam: CDAI Score: -- Patient Global: --; Provider Global: -- Swollen: --; Tender: -- Joint Exam 10/17/2022   No joint exam has been documented for this visit   There is currently no information documented on the homunculus. Go  to the Rheumatology activity and complete the homunculus joint exam.  Investigation: No additional findings.  Imaging: No results found.  Recent Labs: Lab Results  Component Value Date   WBC 3.8 04/11/2022   HGB 12.8 04/11/2022   PLT 195 04/11/2022   NA 142 04/11/2022   K 4.6 04/11/2022   CL 105 04/11/2022   CO2 30 04/11/2022   GLUCOSE 94 04/11/2022   BUN 16 04/11/2022   CREATININE 0.66 04/11/2022   BILITOT 0.6 04/11/2022   ALKPHOS 56 05/01/2019   AST 28 04/11/2022   ALT 21 04/11/2022   PROT 7.0 04/11/2022   ALBUMIN 4.0 05/01/2019   CALCIUM 9.4 04/11/2022   GFRAA 109 06/30/2020    Speciality Comments: Reclast 05/18/22 Fosamax December 2021-April 2024  Procedures:  No procedures performed Allergies: Patient has no known allergies.   Assessment / Plan:     Visit Diagnoses: Age-related osteoporosis without current pathological fracture - November 27, 2021 DEXA scan T score -2.8, BMD 0.533 right femoral neck, no comparison.  Percentage change in AP spine 3%, percent change in left total femur 4%.  Patient was on Fosamax 70 mg p.o. weekly from December 2021 until April 2024.  She had her first Reclast infusion on May 18, 2022.  Patient had no side effects from Reclast.  She has been taking calcium and vitamin D.  We may consider  another Reclast infusion next year.  After that she will have to go on a drug holiday.  Vitamin D deficiency-patient states she had recent vitamin D level with her PCP which was within normal limits.  She will forward results to [place].   Primary osteoarthritis of both hands-she continues to have some stiffness in her hands.  Bilateral CMC PIP and DIP thickening with no synovitis was noted.  It band syndrome, left-she has intermittent discomfort.  IT band stretches were discussed.  DDD (degenerative disc disease), cervical-she has been reducing pain and discomfort in her cervical spine.  She was evaluated by Dr. Debarah Crape.  MRI of the cervical spine was reviewed which showed C5-6 uncovertebral joint hypertrophy and facet joint hypertrophy with mild biforaminal stenosis.  Cervical spine exercises were emphasized.  DDD (degenerative disc disease), lumbar - Lumbar spine discectomy April 2021 by Dr. Johnsie Cancel.  She has been experiencing increased lower back pain recently.  She had mild tenderness over the lower lumbar region.  She denies any radiculopathy.  She was lifting her granddaughter who is heavy.  I offered physical therapy she declined.  She has exercises at home which she will start doing.  Chronic SI joint pain-she has intermittent discomfort.  Stretching exercises were discussed.  Positive ANA (antinuclear antibody) - 10/12/19: AVISE index -2/4: 1:80H, anti-histone weak positive, ENA negative: She denies any history of oral ulcers, nasal ulcers, malar rash, photosensitivity, Raynaud's, inflammatory arthritis or lymphadenopathy.  No clinical features of systemic lupus.  History of gastroesophageal reflux (GERD)  Other fatigue-she has been experiencing increased fatigue which she believes as she has not been very active.  She plans to exercise on a regular basis.  History of depression  Orders: No orders of the defined types were placed in this encounter.  No orders of the defined types were  placed in this encounter.    Follow-Up Instructions: Return in about 7 months (around 05/17/2023) for Osteoarthritis, Osteoporosis.   Pollyann Savoy, MD  Note - This record has been created using Animal nutritionist.  Chart creation errors have been sought, but may not always  have been  located. Such creation errors do not reflect on  the standard of medical care.

## 2022-10-11 HISTORY — PX: VITRECTOMY: SHX106

## 2022-10-17 ENCOUNTER — Encounter: Payer: Self-pay | Admitting: Rheumatology

## 2022-10-17 ENCOUNTER — Ambulatory Visit: Payer: 59 | Attending: Rheumatology | Admitting: Rheumatology

## 2022-10-17 VITALS — BP 123/77 | HR 62 | Resp 14 | Ht 67.0 in | Wt 127.4 lb

## 2022-10-17 DIAGNOSIS — E559 Vitamin D deficiency, unspecified: Secondary | ICD-10-CM | POA: Diagnosis not present

## 2022-10-17 DIAGNOSIS — M19042 Primary osteoarthritis, left hand: Secondary | ICD-10-CM

## 2022-10-17 DIAGNOSIS — R768 Other specified abnormal immunological findings in serum: Secondary | ICD-10-CM

## 2022-10-17 DIAGNOSIS — M19041 Primary osteoarthritis, right hand: Secondary | ICD-10-CM | POA: Diagnosis not present

## 2022-10-17 DIAGNOSIS — G8929 Other chronic pain: Secondary | ICD-10-CM

## 2022-10-17 DIAGNOSIS — R5383 Other fatigue: Secondary | ICD-10-CM

## 2022-10-17 DIAGNOSIS — M533 Sacrococcygeal disorders, not elsewhere classified: Secondary | ICD-10-CM

## 2022-10-17 DIAGNOSIS — M7632 Iliotibial band syndrome, left leg: Secondary | ICD-10-CM | POA: Diagnosis not present

## 2022-10-17 DIAGNOSIS — Z8659 Personal history of other mental and behavioral disorders: Secondary | ICD-10-CM

## 2022-10-17 DIAGNOSIS — M5136 Other intervertebral disc degeneration, lumbar region: Secondary | ICD-10-CM

## 2022-10-17 DIAGNOSIS — M5030 Other cervical disc degeneration, unspecified cervical region: Secondary | ICD-10-CM

## 2022-10-17 DIAGNOSIS — M810 Age-related osteoporosis without current pathological fracture: Secondary | ICD-10-CM | POA: Diagnosis not present

## 2022-10-17 DIAGNOSIS — Z79899 Other long term (current) drug therapy: Secondary | ICD-10-CM

## 2022-10-17 DIAGNOSIS — Z8719 Personal history of other diseases of the digestive system: Secondary | ICD-10-CM

## 2022-12-13 ENCOUNTER — Encounter: Payer: Self-pay | Admitting: Obstetrics and Gynecology

## 2023-01-05 ENCOUNTER — Other Ambulatory Visit: Payer: Self-pay | Admitting: Neurology

## 2023-01-07 NOTE — Telephone Encounter (Signed)
Pt called and LVM needing to speak to the RN regarding this medication. Pt is needing to know if she should schedule an appt for her to be able to get this refill or does she need to schedule an annual. Please advise.

## 2023-05-07 NOTE — Progress Notes (Signed)
 Office Visit Note  Patient: Maureen Davis             Date of Birth: [birth year]           MRN: [id number]             PCP: Olin Bertin, MD Referring: Olin Bertin, MD Visit Date: 05/21/2023 Occupation: @GUAROCC @  Subjective:  Right thumb pain   History of Present Illness: Maureen Davis is a 63 y.o. female with osteoporosis and osteoarthritis.  She returns today after her last visit in October 17, 2022.  Been experiencing increased pain and discomfort in the right.  Which she describes over the [facility] joint.  She has been also having some discomfort in the lower back.  She states she has bilateral lower extremity sciatica intermittently.  She has seen [place] [facility] in the past but not recently.  She has been also going to Dr. Aloysius Janus for neuropathy in her lower extremities.  Her last Reclast  infusion was May 18, 2022.  She has been taking calcium and vitamin D .  She has been working out at [employer] on a regular basis.    Activities of Daily Living:  Patient reports morning stiffness for 5 minutes.   Patient Reports nocturnal pain.  Difficulty dressing/grooming: Denies Difficulty climbing stairs: Denies Difficulty getting out of chair: Denies Difficulty using hands for taps, buttons, cutlery, and/or writing: Denies  Review of Systems  Constitutional:  Positive for fatigue.  HENT:  Negative for mouth sores and mouth dryness.   Eyes:  Negative for dryness.  Respiratory:  Positive for shortness of breath.   Cardiovascular:  Negative for chest pain and palpitations.  Gastrointestinal:  Negative for blood in stool, constipation and diarrhea.  Endocrine: Negative for increased urination.  Genitourinary:  Negative for involuntary urination.  Musculoskeletal:  Positive for joint pain, joint pain, joint swelling, myalgias, muscle weakness, morning stiffness, muscle tenderness and myalgias. Negative for gait problem.  Skin:  Negative for color change, rash, hair loss and  sensitivity to sunlight.  Allergic/Immunologic: Negative for susceptible to infections.  Neurological:  Negative for dizziness and headaches.  Hematological:  Negative for swollen glands.  Psychiatric/Behavioral:  Negative for depressed mood and sleep disturbance. The patient is not nervous/anxious.     PMFS History:  Patient Active Problem List   Diagnosis Date Noted   Hyperreflexia 05/21/2022   Neck pain 05/21/2022   Age related osteoporosis 04/12/2022   Restless leg 09/11/2021   Paresthesia 03/01/2021   Primary osteoarthritis of both hands 11/10/2019   DDD (degenerative disc disease), lumbar 11/10/2019   Lumbar radiculopathy 04/30/2019   DDD (degenerative disc disease), cervical 08/07/2017   Coccygodynia 08/07/2017   Hx of migraines 07/18/2017   History of gastroesophageal reflux (GERD) 07/18/2017   History of depression 07/18/2017   Recurrent UTI 07/18/2017   Leukopenia 07/18/2017    Past Medical History:  Diagnosis Date   Arthritis    osteo   Chronic UTI    Depression    Frequent UTI    --sees Dr.McDiarmid   Headache    migraines went away with ment   Hypertrophy of uterus    Leukopenia 2016   WBC 3.8   Osteoporosis    Polyp of corpus uteri    Uterine prolapse     Family History  Problem Relation Age of Onset   Neuropathy Mother    Charcot-Marie-Tooth disease Mother    Hypertension Father    Osteoarthritis Father    Lupus Father  Thyroid disease Sister        removed    Healthy Son    Healthy Son    Healthy Son    Past Surgical History:  Procedure Laterality Date   CHOLECYSTECTOMY  1998   COLONOSCOPY     CYSTOSCOPY     LUMBAR LAMINECTOMY/ DECOMPRESSION WITH MET-RX Left 05/01/2019   Procedure: LUMBAR FOUR-FIVE LAMINECTOMY/ DECOMPRESSION WITH MET-RX;  Surgeon: Cannon Champion, MD;  Location: [facility] OR;  Service: Neurosurgery;  Laterality: Left;   RETINAL TEAR REPAIR CRYOTHERAPY Right 2021   TUBAL LIGATION  1994   VITRECTOMY Left 10/11/2022    Social History   Social History Narrative   Not on file   Immunization History  Administered Date(s) Administered   PFIZER(Purple Top)SARS-COV-2 Vaccination 06/07/2019, 06/28/2019     Objective: Vital Signs: BP 108/69 (BP Location: Left Arm, Patient Position: Sitting, Cuff Size: Normal)   Pulse 63   Ht 5\' 7"  (1.702 m)   Wt 120 lb (54.4 kg)   LMP 10/20/2014 (Exact Date)   BMI 18.79 kg/m    Physical Exam Vitals and nursing note reviewed.  Constitutional:      Appearance: She is well-developed.  HENT:     Head: Normocephalic and atraumatic.  Eyes:     Conjunctiva/sclera: Conjunctivae normal.  Cardiovascular:     Rate and Rhythm: Normal rate and regular rhythm.     Heart sounds: Normal heart sounds.  Pulmonary:     Effort: Pulmonary effort is normal.     Breath sounds: Normal breath sounds.  Abdominal:     General: Bowel sounds are normal.     Palpations: Abdomen is soft.  Musculoskeletal:     Cervical back: Normal range of motion.  Lymphadenopathy:     Cervical: No cervical adenopathy.  Skin:    General: Skin is warm and dry.     Capillary Refill: Capillary refill takes less than 2 seconds.  Neurological:     Mental Status: She is alert and oriented to person, place, and time.  Psychiatric:        Behavior: Behavior normal.      Musculoskeletal Exam: She had some stiffness with range of motion of the cervical spine.  She had discomfort range of motion of her lumbar spine.  Shoulders, elbows, wrist, MCPs PIPs and DIPs with good range of motion with no synovitis.  She had bilateral PIP and DIP thickening.  She had bilateral CMC thickening more prominent on the right side with some tenderness over the right CMC joint.  Hip joints and knee joints with good range of motion without any warmth swelling or effusion.  There was no tenderness over ankles or MTPs.  CDAI Exam: CDAI Score: -- Patient Global: --; Provider Global: -- Swollen: --; Tender: -- Joint Exam  05/21/2023   No joint exam has been documented for this visit   There is currently no information documented on the homunculus. Go to the Rheumatology activity and complete the homunculus joint exam.  Investigation: No additional findings.  Imaging: No results found.  Recent Labs: Lab Results  Component Value Date   WBC 3.8 04/11/2022   HGB 12.8 04/11/2022   PLT 195 04/11/2022   NA 142 04/11/2022   K 4.6 04/11/2022   CL 105 04/11/2022   CO2 30 04/11/2022   GLUCOSE 94 04/11/2022   BUN 16 04/11/2022   CREATININE 0.66 04/11/2022   BILITOT 0.6 04/11/2022   ALKPHOS 56 05/01/2019   AST 28 04/11/2022   ALT  21 04/11/2022   PROT 7.0 04/11/2022   ALBUMIN 4.0 05/01/2019   CALCIUM 9.4 04/11/2022   GFRAA 109 06/30/2020    Speciality Comments: Reclast  05/18/22 Fosamax  December 2021-April 2024  Procedures:  No procedures performed Allergies: Patient has no known allergies.   Assessment / Plan:     Visit Diagnoses: Age-related osteoporosis without current pathological fracture - November 27, 2021 DEXA scan T score -2.8, BMD 0.533 right femoral neck, no comparison.  Percentage change in AP spine 3%, percent change in left total femur 4%.  Patient had Reclast  infusion on May 18, 2022.  Will proceed with next Reclast  infusion after the labs today.  I will get CMP with GFR and vitamin D  today.  Patient will go on a drug holiday for 2 years after the Reclast  infusion.  Plan to get repeat DEXA scan later this year.  Medication monitoring encounter - Fosamax  12/21-04/24, Reclast  May 18, 2022 and planned repeat Reclast  April 2025 and then drug holiday.  Vitamin D  deficiency-she has been taking vitamin D .  Will check vitamin D  level today.  Primary osteoarthritis of both hands-she has bilateral CMC PIP and DIP thickening.  She has been having increased discomfort and pain in her right CMC joint.  She had been using a CMC brace.  Use of topical agents were discussed.  Patient requests a  right CMC cortisone injection.  I will schedule an ultrasound-guided cortisone injection.  It band syndrome, left-she has intermittent discomfort.  DDD (degenerative disc disease), cervical -she continues to have some stiffness in the cervical spine.  MRI of the cervical spine was reviewed which showed C5-6 uncovertebral joint hypertrophy and facet joint hypertrophy with mild biforaminal stenosis.  Degeneration of intervertebral disc of lumbar region without discogenic back pain or lower extremity pain -she has chronic lower back pain with intermittent radiculopathy.  Status post lumbar discectomy April 2021 by Dr. Dorothy Gates.  Chronic SI joint pain-she had no tenderness on the examination today.  Positive ANA (antinuclear antibody) - 10/12/19: AVISE index -2/4: 1:80H, anti-histone weak positive, ENA negative: She has no clinical features of autoimmune disease.  History of gastroesophageal reflux (GERD)  Tinnitus of both ears  Other fatigue-she has been exercising on a regular basis which helps.  History of depression  Orders: Orders Placed This Encounter  Procedures   Comprehensive metabolic panel with GFR   VITAMIN D  25 Hydroxy (Vit-D Deficiency, Fractures)   No orders of the defined types were placed in this encounter.    Follow-Up Instructions: Return in about 6 months (around 11/20/2023) for Osteoarthritis, Osteoporosis.   Nicholas Bari, MD  Note - This record has been created using Animal nutritionist.  Chart creation errors have been sought, but may not always  have been located. Such creation errors do not reflect on  the standard of medical care.

## 2023-05-21 ENCOUNTER — Encounter: Payer: Self-pay | Admitting: Rheumatology

## 2023-05-21 ENCOUNTER — Ambulatory Visit: Payer: 59 | Attending: Rheumatology | Admitting: Rheumatology

## 2023-05-21 VITALS — BP 108/69 | HR 63 | Ht 67.0 in | Wt 120.0 lb

## 2023-05-21 DIAGNOSIS — M5030 Other cervical disc degeneration, unspecified cervical region: Secondary | ICD-10-CM

## 2023-05-21 DIAGNOSIS — M51369 Other intervertebral disc degeneration, lumbar region without mention of lumbar back pain or lower extremity pain: Secondary | ICD-10-CM

## 2023-05-21 DIAGNOSIS — M19042 Primary osteoarthritis, left hand: Secondary | ICD-10-CM

## 2023-05-21 DIAGNOSIS — M19041 Primary osteoarthritis, right hand: Secondary | ICD-10-CM

## 2023-05-21 DIAGNOSIS — H9313 Tinnitus, bilateral: Secondary | ICD-10-CM

## 2023-05-21 DIAGNOSIS — R768 Other specified abnormal immunological findings in serum: Secondary | ICD-10-CM

## 2023-05-21 DIAGNOSIS — M810 Age-related osteoporosis without current pathological fracture: Secondary | ICD-10-CM | POA: Diagnosis not present

## 2023-05-21 DIAGNOSIS — Z8659 Personal history of other mental and behavioral disorders: Secondary | ICD-10-CM

## 2023-05-21 DIAGNOSIS — M7632 Iliotibial band syndrome, left leg: Secondary | ICD-10-CM

## 2023-05-21 DIAGNOSIS — Z8719 Personal history of other diseases of the digestive system: Secondary | ICD-10-CM

## 2023-05-21 DIAGNOSIS — E559 Vitamin D deficiency, unspecified: Secondary | ICD-10-CM | POA: Diagnosis not present

## 2023-05-21 DIAGNOSIS — R5383 Other fatigue: Secondary | ICD-10-CM

## 2023-05-21 DIAGNOSIS — Z5181 Encounter for therapeutic drug level monitoring: Secondary | ICD-10-CM

## 2023-05-21 DIAGNOSIS — M533 Sacrococcygeal disorders, not elsewhere classified: Secondary | ICD-10-CM

## 2023-05-21 DIAGNOSIS — R7689 Other specified abnormal immunological findings in serum: Secondary | ICD-10-CM

## 2023-05-21 DIAGNOSIS — G8929 Other chronic pain: Secondary | ICD-10-CM

## 2023-05-22 LAB — VITAMIN D 25 HYDROXY (VIT D DEFICIENCY, FRACTURES): Vit D, 25-Hydroxy: 42 ng/mL (ref 30–100)

## 2023-05-22 LAB — COMPREHENSIVE METABOLIC PANEL WITH GFR
AG Ratio: 2.3 (calc) (ref 1.0–2.5)
ALT: 17 U/L (ref 6–29)
AST: 25 U/L (ref 10–35)
Albumin: 4.5 g/dL (ref 3.6–5.1)
Alkaline phosphatase (APISO): 41 U/L (ref 37–153)
BUN: 11 mg/dL (ref 7–25)
CO2: 28 mmol/L (ref 20–32)
Calcium: 9.4 mg/dL (ref 8.6–10.4)
Chloride: 103 mmol/L (ref 98–110)
Creat: 0.69 mg/dL (ref 0.50–1.05)
Globulin: 2 g/dL (ref 1.9–3.7)
Glucose, Bld: 88 mg/dL (ref 65–99)
Potassium: 4.3 mmol/L (ref 3.5–5.3)
Sodium: 139 mmol/L (ref 135–146)
Total Bilirubin: 0.6 mg/dL (ref 0.2–1.2)
Total Protein: 6.5 g/dL (ref 6.1–8.1)
eGFR: 99 mL/min/{1.73_m2} (ref >=60)

## 2023-05-22 NOTE — Progress Notes (Signed)
 CMP normal, vitamin D normal.

## 2023-05-27 ENCOUNTER — Other Ambulatory Visit: Payer: Self-pay | Admitting: Pharmacist

## 2023-05-27 ENCOUNTER — Telehealth: Payer: Self-pay

## 2023-05-27 NOTE — Progress Notes (Signed)
 Patient is due for Reclast  for age-related osteoporosis. Last infusion on 05/18/23  Dose: 5 mg IV every 12 months  Last Clinic Visit: 05/21/23 Next Clinic Visit: 06/12/23  Labs: CMP, Vitamin D  05/21/23 wnl  Orders placed for Reclast  IV x 1 dose along with premedication of acetaminophen  and diphenhydramine  to be administered 30 minutes before medication infusion.  Geraldene Kleine, PharmD, MPH, BCPS, CPP Clinical Pharmacist (Rheumatology and Pulmonology)

## 2023-05-27 NOTE — Telephone Encounter (Signed)
 Dr. Alvira Josephs and Devki, patient will be scheduled as soon as possible.  Auth Submission: NO AUTH NEEDED Site of care: Site of care: [facility] Payer: [employer] commercial Medication & CPT/J Code(s) submitted: Reclast  (Zolendronic acid) [id number] Route of submission (phone, fax, portal):  Phone # Fax # Auth type: Buy/Bill PB Units/visits requested: 5mg  x 1 dose Reference number:  Approval from: 05/27/23 to 01/29/24

## 2023-05-30 ENCOUNTER — Other Ambulatory Visit: Payer: Self-pay | Admitting: Neurology

## 2023-06-05 NOTE — Progress Notes (Signed)
 Reclast  scheduled for 07/16/23

## 2023-06-11 NOTE — Progress Notes (Unsigned)
   Procedure Note  Patient: Maureen Davis             Date of Birth: October 09, 1960           MRN: [id number]             Visit Date: 06/12/2023  Procedures: Visit Diagnoses:  1. Arthritis of carpometacarpal [facility]) joint of right thumb    Visit diagnoses: Right CMC arthritis  Patient was evaluated in the office on May 21, 2023.  Patient was experiencing right CMC joint pain.  She was scheduled to have right CMC ultrasound-guided cortisone injection today.  Small Joint Inj: R thumb CMC on 06/12/2023 3:08 PM Indications: pain Details: 27 G needle, ultrasound-guided radial approach  Spinal Needle: No  Medications: 20 mg triamcinolone acetonide 40 MG/ML; 0.5 mL lidocaine  1 % Aspirate: 0 mL Outcome: tolerated well, no immediate complications  Risk of infection, tendon injury, nerve injury, hypopigmentation and dermal atrophy were discussed. Procedure, treatment alternatives, risks and benefits explained, specific risks discussed. Consent was given by the patient. Immediately prior to procedure a time out was called to verify the correct patient, procedure, equipment, support staff and site/side marked as required. Patient was prepped and draped in the usual sterile fashion.      Patient tolerated the procedure well.  Postprocedure instructions were given.  Nicholas Bari, MD

## 2023-06-12 ENCOUNTER — Ambulatory Visit (INDEPENDENT_AMBULATORY_CARE_PROVIDER_SITE_OTHER)

## 2023-06-12 ENCOUNTER — Ambulatory Visit: Attending: Rheumatology | Admitting: Rheumatology

## 2023-06-12 ENCOUNTER — Other Ambulatory Visit: Payer: Self-pay | Admitting: Neurology

## 2023-06-12 DIAGNOSIS — M1811 Unilateral primary osteoarthritis of first carpometacarpal joint, right hand: Secondary | ICD-10-CM

## 2023-06-12 MED ORDER — LIDOCAINE HCL 1 % IJ SOLN
0.5000 mL | INTRAMUSCULAR | Status: AC | PRN
  Administered 2023-06-12: .5 mL

## 2023-06-12 MED ORDER — TRIAMCINOLONE ACETONIDE 40 MG/ML IJ SUSP
20.0000 mg | INTRAMUSCULAR | Status: AC | PRN
  Administered 2023-06-12: 20 mg via INTRA_ARTICULAR

## 2023-06-13 ENCOUNTER — Encounter: Payer: Self-pay | Admitting: Neurology

## 2023-06-13 MED ORDER — GABAPENTIN 100 MG PO CAPS: 200.0000 mg | ORAL_CAPSULE | Freq: Every day | ORAL | 3 refills | Status: DC

## 2023-06-13 NOTE — Telephone Encounter (Signed)
 Pt called stating that she will be going out of town on Monday and is wanting to know when this will be filled for her. Please advise.

## 2023-07-15 ENCOUNTER — Ambulatory Visit (INDEPENDENT_AMBULATORY_CARE_PROVIDER_SITE_OTHER): Admitting: Neurology

## 2023-07-15 ENCOUNTER — Encounter: Payer: Self-pay | Admitting: Neurology

## 2023-07-15 VITALS — BP 123/75 | HR 75 | Resp 16 | Ht 67.0 in | Wt 124.0 lb

## 2023-07-15 DIAGNOSIS — R202 Paresthesia of skin: Secondary | ICD-10-CM | POA: Diagnosis not present

## 2023-07-15 DIAGNOSIS — G2581 Restless legs syndrome: Secondary | ICD-10-CM | POA: Diagnosis not present

## 2023-07-15 MED ORDER — GABAPENTIN 100 MG PO CAPS: 200.0000 mg | ORAL_CAPSULE | Freq: Three times a day (TID) | ORAL | 11 refills | Status: AC

## 2023-07-15 MED ORDER — CARISOPRODOL 350 MG PO TABS: 350.0000 mg | ORAL_TABLET | Freq: Every day | ORAL | 1 refills | Status: AC | PRN

## 2023-07-15 NOTE — Progress Notes (Signed)
 Chief Complaint  Patient presents with   Follow-up    Rm14, alone,  Paresthesia: worse in bilateral feet and some  head and bilateral hands,  Restless leg: describe more as myalgias and progressively worsened, Hyperreflexia Neck pain: pt stated that its stiff, arthritic, and most of all worse      ASSESSMENT AND PLAN  Maureen Davis is a 63 y.o. female   Bilateral lower extremity paresthesia,  EMG nerve conduction study April 2022 showed no significant abnormality  MRI of lumbar spine with and without contrast July 2023 showed mild degenerative changes, no significant canal or foraminal narrowing,  MRI of the cervical spine showed mild degenerative disease no evidence of canal foraminal narrowing  Normal neurological examination, brisk reflex,  Multiple laboratory evaluation in the past showed no treatable etiology  No evidence of large fiber peripheral neuropathy, she is very much concerned of her persistent intermittent worsening lower extremity paresthesia, would like to refer to [facility] neurology service, referral placed  Higher dose of gabapentin  100 mg up to 200 mg 3 times a day as needed, Soma as needed  Return to clinic in 1 year      DIAGNOSTIC DATA (LABS, IMAGING, TESTING) - I reviewed patient records, labs, notes, testing and imaging myself where available. 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  Reported maternal grandfather also suffered disease,   MEDICAL HISTORY:  Maureen Davis is a patient of Dr. Tilda Fogo, followed up by our clinic for intermittent lower extremity paresthesia, history of lumbar decompression surgery   I reviewed and summarized the referring note. PMHX Osteopenia Depression,  anxiety Lumbar decompression surgery in April 2021.  She suffered back injury waterskiing in 2020, April 2021, she was seen at emergency room for severe low back pain, radiating pain to left lower extremity, also complains of left weakness,  MRI of lumbar spine showed correlating large L4-5 disc herniation, she underwent left L4-5 micro discectomy on May 01, 2019,  Post operative MRI lumbar on Jun 08, 2020 was unremarkable, there was no significant canal or foraminal narrowing  Surgery did help her pain, she still required prolonged rehabilitation, complains of mild residual left lower extremity weakness  Today she also concerned about right foot numbness started around 2021, stayed the same, over the past few months, she noticed intermittent left lower extremity numbness, left ankle constant deep achy pain, getting worse in cold weather, prolonged standing,  The most bothersome symptoms is at nighttime, when she is trying to rest, rating comes alive, she complains left hip discomfort, hard to find a comfortable position, difficult to sit for extended period of time, worsening on the left side, urged to move her leg, occasionally has to pace around  She denied persistent gait abnormality no bowel or bladder incontinence, she does suffer long history of depression anxiety  Repeat MRI of lumbar spine August 18, 2021, L4-5 postsurgical changes from left laminectomy, facetectomy, but no significant canal or foraminal narrowing  UPDATE May 21 2022: She still feel the same way, more pronounced on left, wear shoes all the times, otherwise, it is painful, as if she is walking on  peebles, she went to beach, she could not walk on concrete. She still has night,   She could nto sleep with out  gabapentin  100mg  2 tabs at night,   EMG/NCS in Apirl 2022. Nerve conduction studies done on both lower extremities were relatively unremarkable, without evidence of a peripheral neuropathy. EMG evaluation of  the left lower extremity showed mild chronic stable signs of an L5 radiculopathy with evidence of mild acute denervation in the S1 nerve root distribution. EMG evaluation of the right lower extremity showed mild acute and chronic changes in the S1 nerve root distribution.   UPDATE July 15 2023: Her main concern is worsening bilateral lower extremity discomfort, described as worsening numbness, as if walking on gravel, intermittent sharp stabbing pain, trouble sleeping because of that, constant tingling, moving sensation  She report a strong family history of neuropathy, maternal grandfather has neuropathy, drop foot, in wheelchair, mother has neuropathy at her 38s, use bilateral AFO, walker, one sister at 48, feet has discoloration like her mother.  Other sister has nerve issues in her arm, itching all the time.    She brought mother's Maureen Davis genetic testing result from December 2009, complete CMT evaluation, EGR2 sequencing, unknown variant,  PHYSICAL EXAM:   Vitals:   07/15/23 0904  BP: 123/75  Pulse: 75  Resp: 16  SpO2: 96%  Weight: 124 lb (56.2 kg)  Height: 5' 7 (1.702 m)     Body mass index is 19.42 kg/m.  PHYSICAL EXAMNIATION:  Gen: NAD, conversant, well nourised, well groomed                     Cardiovascular: Regular rate rhythm, no peripheral edema, warm, nontender. Eyes: Conjunctivae clear without exudates or hemorrhage Neck: Supple, no carotid bruits. Pulmonary: Clear to auscultation bilaterally   NEUROLOGICAL EXAM:  MENTAL STATUS: Speech/cognition: Awake, alert, oriented to history taking and casual conversation CRANIAL NERVES: CN II: Visual fields are full to confrontation. Pupils are round equal and briskly reactive to light. CN III, IV, VI: extraocular movement are normal. No ptosis. CN V: Facial sensation is intact to light touch CN VII: Face is symmetric with normal eye closure  CN VIII: Hearing is normal to causal conversation. CN IX, X: Phonation is  normal. CN XI: Head turning and shoulder shrug are intact  MOTOR: There is no pronator drift of out-stretched arms. Muscle bulk and tone are normal. Muscle strength is normal.  REFLEXES: Reflexes are 2+ and symmetric at the biceps, triceps, knees, and ankles. Plantar responses are flexor.  SENSORY: Intact to light touch, pinprick and vibratory sensation are intact in fingers and toes.  COORDINATION: There is no trunk or limb dysmetria noted.  GAIT/STANCE: Posture is normal. Gait is steady    REVIEW OF SYSTEMS:  Full 14 system review of systems performed and notable only for as above All other review of systems were negative.   ALLERGIES: No Known Allergies  HOME MEDICATIONS: Current Outpatient Medications  Medication Sig Dispense Refill   Calcium Carbonate-Vit D-Min (CALCIUM 1200 PO) Take 1 tablet by mouth daily.     carisoprodol (SOMA) 350 MG tablet Take 350 mg by mouth as needed for muscle spasms.     citalopram  (CELEXA ) 10 MG tablet TAKE 2 TABLETS BY MOUTH EVERY DAY 180 tablet 0   gabapentin  (NEURONTIN ) 100 MG capsule Take 2 capsules (200 mg total) by mouth at bedtime. 180 capsule 3   multivitamin-lutein (OCUVITE-LUTEIN) CAPS capsule Take 1 capsule by mouth daily.     Specialty Vitamins Products (NERVIVE NERVE RELIEF PO) Take 1 tablet by mouth daily.     zoledronic  acid (RECLAST ) 5 MG/100ML  SOLN injection Inject 5 mg into the vein once.     No current facility-administered medications for this visit.    PAST MEDICAL HISTORY: Past Medical History:  Diagnosis Date   Arthritis    osteo   Chronic UTI    Depression    Frequent UTI    --sees Dr.McDiarmid   Headache    migraines went away with ment   Hypertrophy of uterus    Leukopenia 2016   WBC 3.8   Osteoporosis    Polyp of corpus uteri    Uterine prolapse     PAST SURGICAL HISTORY: Past Surgical History:  Procedure Laterality Date   CHOLECYSTECTOMY  1998   COLONOSCOPY     CYSTOSCOPY     LUMBAR  LAMINECTOMY/ DECOMPRESSION WITH MET-RX Left 05/01/2019   Procedure: LUMBAR FOUR-FIVE LAMINECTOMY/ DECOMPRESSION WITH MET-RX;  Surgeon: Cannon Champion, MD;  Location: [facility] OR;  Service: Neurosurgery;  Laterality: Left;   RETINAL TEAR REPAIR CRYOTHERAPY Right 2021   TUBAL LIGATION  1994   VITRECTOMY Left 10/11/2022    FAMILY HISTORY: Family History  Problem Relation Age of Onset   Neuropathy Mother    Charcot-Marie-Tooth disease Mother    Hypertension Father    Osteoarthritis Father    Lupus Father    Thyroid disease Sister        removed    Healthy Son    Healthy Son    Healthy Son     SOCIAL HISTORY: Social History   Socioeconomic History   Marital status: Married    Spouse name: Not on file   Number of children: Not on file   Years of education: Not on file   Highest education level: Not on file  Occupational History   Not on file  Tobacco Use   Smoking status: Never    Passive exposure: Never   Smokeless tobacco: Never  Vaping Use   Vaping status: Never Used  Substance and Sexual Activity   Alcohol use: Yes    Comment: occ   Drug use: Never   Sexual activity: Yes    Partners: Male    Birth control/protection: Surgical    Comment: Tubal Ligation  Other Topics Concern   Not on file  Social History Narrative   Not on file   Social Drivers of Health   Financial Resource Strain: Not on file  Food Insecurity: Not on file  Transportation Needs: Not on file  Physical Activity: Not on file  Stress: Not on file  Social Connections: Unknown (06/13/2021)   Received from [facility]   Social Network    Social Network: Not on file  Intimate Partner Violence: Unknown (05/05/2021)   Received from [facility]   HITS    Physically Hurt: Not on file    Insult or Talk Down To: Not on file    Threaten Physical Harm: Not on file    Scream or Curse: Not on file      Maureen Davis, M.D. Ph.D.  [facility] [address] [place],  [place] [address] Ph: [phone] Fax: [phone]  CC:  Olin Bertin, MD [address] [place],  [place] [address]  Olin Bertin, MD

## 2023-07-16 ENCOUNTER — Telehealth: Payer: Self-pay | Admitting: Neurology

## 2023-07-16 ENCOUNTER — Ambulatory Visit

## 2023-07-16 NOTE — Telephone Encounter (Signed)
 Referral for neurology fax to [facility]. Phone: [phone], Fax: [phone]

## 2023-07-16 NOTE — Progress Notes (Signed)
 Reclast  rescheduled for 08/26/23

## 2023-08-26 ENCOUNTER — Ambulatory Visit: Admitting: Obstetrics and Gynecology

## 2023-08-26 ENCOUNTER — Other Ambulatory Visit: Payer: Self-pay | Admitting: Pharmacist

## 2023-08-26 ENCOUNTER — Ambulatory Visit (INDEPENDENT_AMBULATORY_CARE_PROVIDER_SITE_OTHER)

## 2023-08-26 VITALS — BP 103/64 | HR 64 | Temp 97.9°F | Resp 16 | Ht 67.0 in | Wt 126.6 lb

## 2023-08-26 DIAGNOSIS — M810 Age-related osteoporosis without current pathological fracture: Secondary | ICD-10-CM

## 2023-08-26 MED ORDER — DIPHENHYDRAMINE HCL 25 MG PO CAPS
25.0000 mg | ORAL_CAPSULE | Freq: Once | ORAL | Status: AC
Start: 2023-08-26 — End: 2023-08-26
  Administered 2023-08-26: 25 mg via ORAL
  Filled 2023-08-26: qty 1

## 2023-08-26 MED ORDER — ZOLEDRONIC ACID 5 MG/100ML IV SOLN
5.0000 mg | Freq: Once | INTRAVENOUS | Status: AC
  Administered 2023-08-26: 5 mg via INTRAVENOUS
  Filled 2023-08-26: qty 100

## 2023-08-26 MED ORDER — ACETAMINOPHEN 325 MG PO TABS
650.0000 mg | ORAL_TABLET | Freq: Once | ORAL | Status: AC
  Administered 2023-08-26: 650 mg via ORAL
  Filled 2023-08-26: qty 2

## 2023-08-26 NOTE — Progress Notes (Signed)
 Patient has completed three years of oral bisphosphonate and now two years of IV bisphosphonate Therapy. Will need to be on drug holiday  DEXA due to be updated in November 2025. Order placed for [facility]  Sherry Pennant, PharmD, MPH, BCPS, CPP Clinical Pharmacist (Rheumatology and Pulmonology)

## 2023-08-26 NOTE — Progress Notes (Signed)
 Diagnosis: Osteoporosis  Provider:  Lonna Coder MD  Procedure: IV Infusion  IV Type: Peripheral, IV Location: R Antecubital  Reclast  (Zolendronic Acid), Dose: 5 mg  Infusion Start Time: 0948  Infusion Stop Time: 1020  Post Infusion IV Care: Observation period completed and Peripheral IV Discontinued  Discharge: Condition: Good, Destination: Home . AVS Declined  Performed by:  Maximiano JONELLE Pouch, LPN

## 2023-08-29 NOTE — Progress Notes (Unsigned)
 63 y.o. G10P3003 Married Caucasian female here for annual exam.    PCP: Verena Mems, MD   Patient's last menstrual period was 10/20/2014 (exact date).           Sexually active: Yes.    The current method of family planning is tubal ligation.    Menopausal hormone therapy:  NA Exercising: {yes no:314532}  {types:19826} Smoker:  no  OB History  Gravida Para Term Preterm AB Living  3 3 3   3   SAB IAB Ectopic Multiple Live Births          # Outcome Date GA Lbr Len/2nd Weight Sex Type Anes PTL Lv  3 Term 10/27/92     Vag-Spont     2 Term 05/12/90     Vag-Spont     1 Term 09/06/87     Vag-Spont        HEALTH MAINTENANCE: Last 2 paps:  06/20/22- Neg. HPV HR NEG. 01/10/18- Neg. HPV. Neg History of abnormal Pap or positive HPV:  no Mammogram:   12/11/22, [facility] Cat. 1 Neg Colonoscopy:   Bone Density:  11/29/21  Result  Osteoporosis in multiple sites    Immunization History  Administered Date(s) Administered   PFIZER(Purple Top)SARS-COV-2 Vaccination 06/07/2019, 06/28/2019      reports that she has never smoked. She has never been exposed to tobacco smoke. She has never used smokeless tobacco. She reports current alcohol use. She reports that she does not use drugs.  Past Medical History:  Diagnosis Date   Arthritis    osteo   Chronic UTI    Depression    Frequent UTI    --sees Dr.McDiarmid   Headache    migraines went away with ment   Hypertrophy of uterus    Leukopenia 2016   WBC 3.8   Osteoporosis    Polyp of corpus uteri    Uterine prolapse     Past Surgical History:  Procedure Laterality Date   CHOLECYSTECTOMY  1998   COLONOSCOPY     CYSTOSCOPY     LUMBAR LAMINECTOMY/ DECOMPRESSION WITH MET-RX Left 05/01/2019   Procedure: LUMBAR FOUR-FIVE LAMINECTOMY/ DECOMPRESSION WITH MET-RX;  Surgeon: Cheryle Debby LABOR, MD;  Location: [facility] OR;  Service: Neurosurgery;  Laterality: Left;   RETINAL TEAR REPAIR CRYOTHERAPY Right 2021   TUBAL LIGATION  1994    VITRECTOMY Left 10/11/2022    Current Outpatient Medications  Medication Sig Dispense Refill   Calcium Carbonate-Vit D-Min (CALCIUM 1200 PO) Take 1 tablet by mouth daily.     carisoprodol  (SOMA ) 350 MG tablet Take 1 tablet (350 mg total) by mouth daily as needed for muscle spasms. 30 tablet 1   citalopram  (CELEXA ) 10 MG tablet TAKE 2 TABLETS BY MOUTH EVERY DAY 180 tablet 0   gabapentin  (NEURONTIN ) 100 MG capsule Take 2 capsules (200 mg total) by mouth 3 (three) times daily. 180 capsule 11   multivitamin-lutein (OCUVITE-LUTEIN) CAPS capsule Take 1 capsule by mouth daily.     Specialty Vitamins Products (NERVIVE NERVE RELIEF PO) Take 1 tablet by mouth daily.     zoledronic  acid (RECLAST ) 5 MG/100ML SOLN injection Inject 5 mg into the vein once.     No current facility-administered medications for this visit.    ALLERGIES: Patient has no known allergies.  Family History  Problem Relation Age of Onset   Neuropathy Mother    Charcot-Marie-Tooth disease Mother    Hypertension Father    Osteoarthritis Father    Lupus Father  Thyroid disease Sister        removed    Healthy Son    Healthy Son    Healthy Son     Review of Systems  PHYSICAL EXAM:  LMP 10/20/2014 (Exact Date)     General appearance: alert, cooperative and appears stated age Head: normocephalic, without obvious abnormality, atraumatic Neck: no adenopathy, supple, symmetrical, trachea midline and thyroid normal to inspection and palpation Lungs: clear to auscultation bilaterally Breasts: normal appearance, no masses or tenderness, No nipple retraction or dimpling, No nipple discharge or bleeding, No axillary adenopathy Heart: regular rate and rhythm Abdomen: soft, non-tender; no masses, no organomegaly Extremities: extremities normal, atraumatic, no cyanosis or edema Skin: skin color, texture, turgor normal. No rashes or lesions Lymph nodes: cervical, supraclavicular, and axillary nodes normal. Neurologic: grossly  normal  Pelvic: External genitalia:  no lesions              No abnormal inguinal nodes palpated.              Urethra:  normal appearing urethra with no masses, tenderness or lesions              Bartholins and Skenes: normal                 Vagina: normal appearing vagina with normal color and discharge, no lesions              Cervix: no lesions              Pap taken: {yes no:314532} Bimanual Exam:  Uterus:  normal size, contour, position, consistency, mobility, non-tender              Adnexa: no mass, fullness, tenderness              Rectal exam: {yes no:314532}.  Confirms.              Anus:  normal sphincter tone, no lesions  Chaperone was present for exam:  {BSCHAPERONE:31226::Emily F, CMA}  ASSESSMENT: Well woman visit with gynecologic exam.  PHQ-2-9: ***  ***  PLAN: Mammogram screening discussed. Self breast awareness reviewed. Pap and HRV collected:  {yes no:314532} Guidelines for Calcium, Vitamin D , regular exercise program including cardiovascular and weight bearing exercise. Medication refills:  *** {LABS (Optional):23779} Follow up:  ***    Additional counseling given.  {yes [id number]. ***  total time was spent for this patient encounter, including preparation, face-to-face counseling with the patient, coordination of care, and documentation of the encounter in addition to doing the well woman visit with gynecologic exam.

## 2023-08-30 ENCOUNTER — Encounter: Payer: Self-pay | Admitting: Obstetrics and Gynecology

## 2023-08-30 ENCOUNTER — Ambulatory Visit: Admitting: Obstetrics and Gynecology

## 2023-08-30 VITALS — BP 110/64 | HR 71 | Ht 67.72 in | Wt 123.8 lb

## 2023-08-30 DIAGNOSIS — Z1331 Encounter for screening for depression: Secondary | ICD-10-CM | POA: Diagnosis not present

## 2023-08-30 DIAGNOSIS — Z01419 Encounter for gynecological examination (general) (routine) without abnormal findings: Secondary | ICD-10-CM

## 2023-08-30 NOTE — Patient Instructions (Signed)

## 2023-09-07 ENCOUNTER — Other Ambulatory Visit: Payer: Self-pay | Admitting: Neurology

## 2023-11-25 ENCOUNTER — Ambulatory Visit: Admitting: Rheumatology

## 2023-12-24 ENCOUNTER — Ambulatory Visit: Admitting: Rheumatology

## 2024-01-03 LAB — HM MAMMOGRAPHY

## 2024-01-03 LAB — HM DEXA SCAN

## 2024-01-06 ENCOUNTER — Telehealth: Payer: Self-pay

## 2024-01-06 ENCOUNTER — Encounter: Payer: Self-pay | Admitting: Obstetrics and Gynecology

## 2024-01-06 ENCOUNTER — Ambulatory Visit: Payer: Self-pay | Admitting: Obstetrics and Gynecology

## 2024-01-06 NOTE — Telephone Encounter (Signed)
 Received DEXA results from [facility].  Date of Scan: 01/03/2024  Lowest T-score: -2.7  BMD: 0.553  Lowest site measured: right femoral neck  DX: osteoporosis   Significant changes in BMD and site measured (5% and above):n/a  Current Regimen: osteoporosis   Recommendation: minimal improvement in bone density noted, still in osteoporosis range, will discuss treatment options at the follow up visit.   Reviewed by: Dr. Dolphus  Next Appointment:  03/26/2024  Copy of DEXA sent to the scan center.

## 2024-01-07 NOTE — Progress Notes (Signed)
 There is no significant improvement in the DEXA scan results.  We will discuss other treatment options at the follow-up visit.

## 2024-03-26 ENCOUNTER — Ambulatory Visit: Admitting: Rheumatology

## 2024-07-14 ENCOUNTER — Ambulatory Visit: Admitting: Neurology

## 2024-09-02 ENCOUNTER — Ambulatory Visit: Admitting: Obstetrics and Gynecology
